# Patient Record
Sex: Female | Born: 1970
Health system: Southern US, Community
[De-identification: ages and names within clinical notes are randomized; demographics above are authoritative.]

## PROBLEM LIST (undated history)

## (undated) DIAGNOSIS — D580 Hereditary spherocytosis: Secondary | ICD-10-CM

## (undated) HISTORY — DX: Hereditary spherocytosis: D58.0

## (undated) HISTORY — PX: FOOT SURGERY: SHX648

---

## 2015-01-08 ENCOUNTER — Ambulatory Visit: Payer: 59

## 2015-01-08 ENCOUNTER — Other Ambulatory Visit (HOSPITAL_BASED_OUTPATIENT_CLINIC_OR_DEPARTMENT_OTHER): Payer: 59 | Admitting: Lab

## 2015-01-08 ENCOUNTER — Encounter: Payer: Self-pay | Admitting: Family

## 2015-01-08 ENCOUNTER — Ambulatory Visit (HOSPITAL_BASED_OUTPATIENT_CLINIC_OR_DEPARTMENT_OTHER): Payer: 59 | Admitting: Family

## 2015-01-08 VITALS — BP 93/53 | HR 68 | Temp 97.8°F | Resp 14 | Ht 65.0 in | Wt 126.0 lb

## 2015-01-08 DIAGNOSIS — D580 Hereditary spherocytosis: Secondary | ICD-10-CM | POA: Diagnosis not present

## 2015-01-08 HISTORY — DX: Hereditary spherocytosis: D58.0

## 2015-01-08 LAB — CBC WITH DIFFERENTIAL (CANCER CENTER ONLY)
BASO#: 0 10*3/uL (ref 0.0–0.2)
BASO%: 0.2 % (ref 0.0–2.0)
EOS%: 2.1 % (ref 0.0–7.0)
Eosinophils Absolute: 0.1 10*3/uL (ref 0.0–0.5)
HCT: 28.9 % — ABNORMAL LOW (ref 34.8–46.6)
HGB: 10.4 g/dL — ABNORMAL LOW (ref 11.6–15.9)
LYMPH#: 1.4 10*3/uL (ref 0.9–3.3)
LYMPH%: 29.4 % (ref 14.0–48.0)
MCH: 33.5 pg (ref 26.0–34.0)
MCHC: 36 g/dL (ref 32.0–36.0)
MCV: 93 fL (ref 81–101)
MONO#: 0.3 10*3/uL (ref 0.1–0.9)
MONO%: 5.7 % (ref 0.0–13.0)
NEUT#: 3.1 10*3/uL (ref 1.5–6.5)
NEUT%: 62.6 % (ref 39.6–80.0)
Platelets: 119 10*3/uL — ABNORMAL LOW (ref 145–400)
RBC: 3.1 10*6/uL — ABNORMAL LOW (ref 3.70–5.32)
RDW: 18.1 % — ABNORMAL HIGH (ref 11.1–15.7)
WBC: 4.9 10*3/uL (ref 3.9–10.0)

## 2015-01-08 LAB — IRON AND TIBC CHCC
%SAT: 31 % (ref 21–57)
Iron: 79 ug/dL (ref 41–142)
TIBC: 253 ug/dL (ref 236–444)
UIBC: 173 ug/dL (ref 120–384)

## 2015-01-08 LAB — TECHNOLOGIST REVIEW CHCC SATELLITE

## 2015-01-08 LAB — CHCC SATELLITE - SMEAR

## 2015-01-08 LAB — FERRITIN CHCC: Ferritin: 76 ng/ml (ref 9–269)

## 2015-01-08 MED ORDER — FOLIC ACID 1 MG PO TABS: 1.0000 mg | ORAL_TABLET | Freq: Every day | ORAL | Status: AC

## 2015-01-08 NOTE — Progress Notes (Signed)
Hematology/Oncology Consultation   Name: Rubylee Zamarripa      MRN: [id number]    Location: Room/bed info not found  Date: 01/08/2015 Time:11:23 AM   REFERRING PHYSICIAN:  Louretta Shorten   REASON FOR CONSULT:  Hereditary spherocytosis   DIAGNOSIS:  Hereditary spherocytosis  HISTORY OF PRESENT ILLNESS: Ms. Sabatino is a very pleasant 44 yo female with hereditary spherocytosis. This was discovered while she was pregnant years ago. Her mother, aunts and cousins also have this and several of them have had their spleens removed. She still has her spleen. She has not had to have transfusions. She takes folic acid, vitamin B complex and a multivitamin daily.  She has 5 healthy children and no miscarriages.  No personal or familial history of bleeding or clotting disorders.  Her mother had uterine cancer and unfortunately passed away from this.  She stays very active running, biking and swimming daily. 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 She does not smoke or drink alcohol. Her cycles are normal and not heavy.  She states that her pap smear and mammogram for this year were both normal.   She denies fever, chills, n/v, cough, rash, headache, dizziness, SOB, chest pain, palpitations, abdominal pain, constipation, diarrhea, blood in urine or stool.  No swelling, tenderness, numbness or tingling in her extremities.  Her appetite is good and she stays hydrated. Her weight is stable at 126 lbs.     ROS: All other 10 point review of systems is negative.   PAST MEDICAL HISTORY:   Past Medical History  Diagnosis Date  . Spherocytosis, hereditary 01/08/2015    ALLERGIES: No Known Allergies    MEDICATIONS:  No current outpatient prescriptions on file prior to visit.   No current facility-administered medications on file prior to visit.     PAST SURGICAL HISTORY No past surgical history on file.  FAMILY HISTORY: No family history on file.  SOCIAL HISTORY:  reports that  she has never smoked. She has never used smokeless tobacco. Her alcohol and drug histories are not on file.  PERFORMANCE STATUS: The patient's performance status is 0 - Asymptomatic  PHYSICAL EXAM: Most Recent Vital Signs: Blood pressure 93/53, pulse 68, temperature 97.8 F (36.6 C), temperature source Oral, resp. rate 14, height 5\' 5"  (1.651 m), weight 126 lb (57.153 kg). BP 93/53 mmHg  Pulse 68  Temp(Src) 97.8 F (36.6 C) (Oral)  Resp 14  Ht 5\' 5"  (1.651 m)  Wt 126 lb (57.153 kg)  BMI 20.97 kg/m2  General Appearance:    Alert, cooperative, no distress, appears stated age  Head:    Normocephalic, without obvious abnormality, atraumatic  Eyes:    PERRL, conjunctiva/corneas clear, EOM's intact, fundi    benign, both eyes        Throat:   Lips, mucosa, and tongue normal; teeth and gums normal  Neck:   Supple, symmetrical, trachea midline, no adenopathy;    thyroid:  no enlargement/tenderness/nodules; no carotid   bruit or JVD  Back:     Symmetric, no curvature, ROM normal, no CVA tenderness  Lungs:     Clear to auscultation bilaterally, respirations unlabored  Chest Wall:    No tenderness or deformity   Heart:    Regular rate and rhythm, S1 and S2 normal, no murmur, rub   or gallop     Abdomen:     Soft, non-tender, bowel sounds active all four quadrants,    no masses, no organomegaly  Extremities:   Extremities normal, atraumatic, no cyanosis or edema  Pulses:   2+ and symmetric all extremities  Skin:   Skin color, texture, turgor normal, no rashes or lesions  Lymph nodes:   Cervical, supraclavicular, and axillary nodes normal  Neurologic:   CNII-XII intact, normal strength, sensation and reflexes    throughout    LABORATORY DATA:  No results found for this or any previous visit (from the past 48 hour(s)).    RADIOGRAPHY: No results found.     PATHOLOGY: None  ASSESSMENT/PLAN: Ms. Jamroz is a very pleasant 44 yo female with hereditary spherocytosis. This was  discovered while she was pregnant years ago. She has had no complications with this and still has her spleen.  She will continue taking her B complex and multivitamin daily.  She will start taking 1 mg of folic acid daily. Her Hgb today is 10.4 MCV 93. We will see what the rest of her labs show.  All questions were answered. She knows to call the clinic with any problems, questions or concerns.  At this time we do not need to make a follow-up appointment with her. However, we are always available for any future hematological need she might have.  The patient was discussed with and also seen by Dr. Marin Olp and he is in agreement with the aforementioned.   [facility]   Addendum:     I saw and examined the patient with Armiyah Capron. We'll get her blood smear. She had some spherocytes. There is no schistocytes. She had no nucleated red blood cells. White blood cells. Normal in morphology and maturation. There were no immature myeloid cells. There were no hypersegmented polys. Platelets appeared adequate in number and size.  It seems as if her spherocytosis is pretty well compensated. She pretty much is asymptomatic with this.  I talked her about the issues with gallstones. There is a higher risk of gallstone disease and cholecystitis with spherocytosis. So far, she has not had any type of gallbladder attacks.  She needs to increase the amount of folic acid that she is taking. She will need to take 1 mg of folic acid daily. We will call the prescription in for her.  I agree with the exam done by Judson Roch.  For now, I just don't think we have to get her back to be seen. She's asymptomatic. She is healthy. If there are any issues, she certainly knows that she can give [place] a call.  We spent about 45 minutes with her today. We answered all of her questions.  Laurey Arrow

## 2015-01-09 LAB — LACTATE DEHYDROGENASE: LDH: 145 U/L (ref 94–250)

## 2015-01-09 LAB — RETICULOCYTES (CHCC)
ABS Retic: 256 10*3/uL — ABNORMAL HIGH (ref 19.0–186.0)
RBC.: 3.2 MIL/uL — ABNORMAL LOW (ref 3.87–5.11)
Retic Ct Pct: 8 % — ABNORMAL HIGH (ref 0.4–2.3)

## 2015-01-09 LAB — HAPTOGLOBIN: Haptoglobin: <15 mg/dL — ABNORMAL LOW (ref 43–212)

## 2015-11-16 LAB — CBC AND DIFFERENTIAL
HCT: 30 % — AB (ref 36–46)
Hemoglobin: 10.7 g/dL — AB (ref 12.0–16.0)
Neutrophils Absolute: 4 /uL
Platelets: 142 10*3/uL — AB (ref 150–399)
WBC: 5.8 10^3/mL

## 2015-11-16 LAB — HEPATIC FUNCTION PANEL
ALT: 14 U/L (ref 7–35)
AST: 18 U/L (ref 13–35)
Alkaline Phosphatase: 44 U/L (ref 25–125)
Bilirubin, Total: 7.1 mg/dL

## 2015-11-16 LAB — BASIC METABOLIC PANEL
BUN: 13 mg/dL (ref 4–21)
Creatinine: 0.6 mg/dL (ref <=1.1)
Potassium: 4.2 mmol/L (ref 3.4–5.3)
Sodium: 141 mmol/L (ref 137–147)

## 2015-11-16 LAB — LIPID PANEL
Cholesterol: 129 mg/dL (ref 0–200)
HDL: 70 mg/dL (ref 35–70)
LDL Cholesterol: 50 mg/dL
Triglycerides: 46 mg/dL (ref 40–160)

## 2015-11-16 LAB — TSH: TSH: 1.35 u[IU]/mL (ref <=5.90)

## 2015-11-26 LAB — HM MAMMOGRAPHY: HM Mammogram: NORMAL (ref 0–4)

## 2015-11-26 LAB — HM PAP SMEAR

## 2015-12-21 DIAGNOSIS — Z6820 Body mass index (BMI) 20.0-20.9, adult: Secondary | ICD-10-CM | POA: Diagnosis not present

## 2015-12-21 DIAGNOSIS — Z1231 Encounter for screening mammogram for malignant neoplasm of breast: Secondary | ICD-10-CM | POA: Diagnosis not present

## 2015-12-21 DIAGNOSIS — Z01419 Encounter for gynecological examination (general) (routine) without abnormal findings: Secondary | ICD-10-CM | POA: Diagnosis not present

## 2015-12-21 LAB — HM PAP SMEAR

## 2015-12-21 LAB — HM MAMMOGRAPHY

## 2016-05-31 DIAGNOSIS — B354 Tinea corporis: Secondary | ICD-10-CM | POA: Diagnosis not present

## 2016-08-05 ENCOUNTER — Encounter: Payer: Self-pay | Admitting: General Practice

## 2016-11-25 ENCOUNTER — Encounter: Payer: Self-pay | Admitting: Family Medicine

## 2016-11-25 ENCOUNTER — Other Ambulatory Visit: Payer: 59

## 2016-11-25 ENCOUNTER — Ambulatory Visit (INDEPENDENT_AMBULATORY_CARE_PROVIDER_SITE_OTHER): Payer: 59 | Admitting: Family Medicine

## 2016-11-25 VITALS — BP 98/62 | HR 52 | Temp 98.1°F | Resp 16 | Ht 65.0 in | Wt 125.2 lb

## 2016-11-25 DIAGNOSIS — Z0000 Encounter for general adult medical examination without abnormal findings: Secondary | ICD-10-CM

## 2016-11-25 DIAGNOSIS — Z1231 Encounter for screening mammogram for malignant neoplasm of breast: Secondary | ICD-10-CM | POA: Diagnosis not present

## 2016-11-25 LAB — LIPID PANEL
Cholesterol: 122 mg/dL (ref 0–200)
HDL: 55.6 mg/dL (ref >39.00)
LDL Cholesterol: 57 mg/dL (ref 0–99)
NonHDL: 66.27
Total CHOL/HDL Ratio: 2
Triglycerides: 47 mg/dL (ref 0.0–149.0)
VLDL: 9.4 mg/dL (ref 0.0–40.0)

## 2016-11-25 LAB — BASIC METABOLIC PANEL
BUN: 16 mg/dL (ref 6–23)
CO2: 27 mEq/L (ref 19–32)
Calcium: 9.3 mg/dL (ref 8.4–10.5)
Chloride: 106 mEq/L (ref 96–112)
Creatinine, Ser: 0.66 mg/dL (ref 0.40–1.20)
GFR: 102.32 mL/min (ref >60.00)
Glucose, Bld: 96 mg/dL (ref 70–99)
Potassium: 4 mEq/L (ref 3.5–5.1)
Sodium: 140 mEq/L (ref 135–145)

## 2016-11-25 LAB — CBC WITH DIFFERENTIAL/PLATELET
Basophils Absolute: 0 cells/uL (ref 0–200)
Basophils Relative: 0 %
Eosinophils Absolute: 88 cells/uL (ref 15–500)
Eosinophils Relative: 2 %
HCT: 31 % — ABNORMAL LOW (ref 35.0–45.0)
Hemoglobin: 10.6 g/dL — ABNORMAL LOW (ref 11.7–15.5)
Lymphocytes Relative: 31 %
Lymphs Abs: 1364 cells/uL (ref 850–3900)
MCH: 32.8 pg (ref 27.0–33.0)
MCHC: 34.2 g/dL (ref 32.0–36.0)
MCV: 96 fL (ref 80.0–100.0)
MPV: 10.8 fL (ref 7.5–12.5)
Monocytes Absolute: 220 cells/uL (ref 200–950)
Monocytes Relative: 5 %
Neutro Abs: 2728 cells/uL (ref 1500–7800)
Neutrophils Relative %: 62 %
Platelets: 137 10*3/uL — ABNORMAL LOW (ref 140–400)
RBC: 3.23 MIL/uL — ABNORMAL LOW (ref 3.80–5.10)
RDW: 16 % — ABNORMAL HIGH (ref 11.0–15.0)
WBC: 4.4 10*3/uL (ref 3.8–10.8)

## 2016-11-25 LAB — HEPATIC FUNCTION PANEL
ALT: 13 U/L (ref 0–35)
AST: 16 U/L (ref 0–37)
Albumin: 4.7 g/dL (ref 3.5–5.2)
Alkaline Phosphatase: 39 U/L (ref 39–117)
Bilirubin, Direct: 0.3 mg/dL (ref 0.0–0.3)
Total Bilirubin: 1.7 mg/dL — ABNORMAL HIGH (ref 0.2–1.2)
Total Protein: 7.1 g/dL (ref 6.0–8.3)

## 2016-11-25 LAB — TSH: TSH: 0.74 u[IU]/mL (ref 0.35–4.50)

## 2016-11-25 LAB — VITAMIN D 25 HYDROXY (VIT D DEFICIENCY, FRACTURES): VITD: 25.79 ng/mL — ABNORMAL LOW (ref 30.00–100.00)

## 2016-11-25 MED ORDER — CLOTRIMAZOLE-BETAMETHASONE 1-0.05 % EX CREA: 1.0000 "application " | TOPICAL_CREAM | Freq: Two times a day (BID) | CUTANEOUS | 1 refills | Status: DC

## 2016-11-25 NOTE — Progress Notes (Signed)
   Subjective:    Patient ID: Leah Willis, female    DOB: 15-Oct-1971, 45 y.o.   MRN: [id number]  HPI New to establish.  Previous MD- [facility]  CPE- UTD on pap and mammo (Dec 2016).  UTD on Tdap.  Declines flu shot.   Review of Systems Patient reports no vision/ hearing changes, adenopathy,fever, weight change,  persistant/recurrent hoarseness , swallowing issues, chest pain, palpitations, edema, persistant/recurrent cough, hemoptysis, dyspnea (rest/exertional/paroxysmal nocturnal), gastrointestinal bleeding (melena, rectal bleeding), abdominal pain, significant heartburn, bowel changes, GU symptoms (dysuria, hematuria, incontinence), Gyn symptoms (abnormal  bleeding, pain),  syncope, focal weakness, memory loss, numbness & tingling, hair/nail changes, abnormal bruising or bleeding, anxiety, or depression.   + skin lesion- L lower leg, has been treated as ring worm w/ some improvement but it has returned    Objective:   Physical Exam General Appearance:    Alert, cooperative, no distress, appears stated age  Head:    Normocephalic, without obvious abnormality, atraumatic  Eyes:    PERRL, conjunctiva/corneas clear, EOM's intact, fundi    benign, both eyes  Ears:    Normal TM's and external ear canals, both ears  Nose:   Nares normal, septum midline, mucosa normal, no drainage    or sinus tenderness  Throat:   Lips, mucosa, and tongue normal; teeth and gums normal  Neck:   Supple, symmetrical, trachea midline, no adenopathy;    Thyroid: no enlargement/tenderness/nodules  Back:     Symmetric, no curvature, ROM normal, no CVA tenderness  Lungs:     Clear to auscultation bilaterally, respirations unlabored  Chest Wall:    No tenderness or deformity   Heart:    Regular rate and rhythm, S1 and S2 normal, no murmur, rub   or gallop  Breast Exam:    Deferred to mammo  Abdomen:     Soft, non-tender, bowel sounds active all four quadrants,    no masses, no  organomegaly  Genitalia:    Deferred to GYN  Rectal:    Extremities:   Extremities normal, atraumatic, no cyanosis or edema  Pulses:   2+ and symmetric all extremities  Skin:   Skin color, texture, turgor normal, no rashes or lesions  Lymph nodes:   Cervical, supraclavicular, and axillary nodes normal  Neurologic:   CNII-XII intact, normal strength, sensation and reflexes    throughout          Assessment & Plan:

## 2016-11-25 NOTE — Progress Notes (Signed)
Pre visit review using our clinic review tool, if applicable. No additional management support is needed unless otherwise documented below in the visit note. 

## 2016-11-25 NOTE — Patient Instructions (Signed)
Follow up in 1 year or as needed We'll notify you of your lab results and make any changes if needed Continue to work on healthy diet and regular exercise- you look great!! We'll call you to schedule your mammogram Apply the Triamcinolone/Steroid combo twice daily.  If no improvement after 2 weeks, please let me know so we can refer to Derm Call with any questions or concerns Happy Holidays! Welcome!  We're glad to have you!!

## 2016-11-25 NOTE — Assessment & Plan Note (Signed)
Pt's PE WNL.  UTD on GYN, Tdap.  Due for mammo- order entered.  Check labs.  Anticipatory guidance provided.

## 2016-11-26 ENCOUNTER — Other Ambulatory Visit: Payer: Self-pay | Admitting: Family Medicine

## 2016-11-26 ENCOUNTER — Other Ambulatory Visit: Payer: Self-pay | Admitting: General Practice

## 2016-11-26 DIAGNOSIS — Z1231 Encounter for screening mammogram for malignant neoplasm of breast: Secondary | ICD-10-CM

## 2016-11-26 MED ORDER — VITAMIN D (ERGOCALCIFEROL) 1.25 MG (50000 UNIT) PO CAPS: 50000.0000 [IU] | ORAL_CAPSULE | ORAL | 0 refills | Status: DC

## 2016-12-02 ENCOUNTER — Encounter: Payer: Self-pay | Admitting: General Practice

## 2016-12-23 ENCOUNTER — Ambulatory Visit: Payer: 59 | Admitting: Family Medicine

## 2017-01-01 ENCOUNTER — Ambulatory Visit: Payer: 59

## 2017-06-09 DIAGNOSIS — H5213 Myopia, bilateral: Secondary | ICD-10-CM | POA: Diagnosis not present

## 2017-11-27 ENCOUNTER — Ambulatory Visit (INDEPENDENT_AMBULATORY_CARE_PROVIDER_SITE_OTHER): Payer: 59 | Admitting: Family Medicine

## 2017-11-27 ENCOUNTER — Other Ambulatory Visit: Payer: Self-pay

## 2017-11-27 ENCOUNTER — Other Ambulatory Visit: Payer: 59

## 2017-11-27 ENCOUNTER — Encounter: Payer: Self-pay | Admitting: Family Medicine

## 2017-11-27 VITALS — BP 118/78 | HR 68 | Temp 98.2°F | Resp 16 | Ht 65.0 in | Wt 129.5 lb

## 2017-11-27 DIAGNOSIS — R7989 Other specified abnormal findings of blood chemistry: Secondary | ICD-10-CM | POA: Diagnosis not present

## 2017-11-27 DIAGNOSIS — Z0000 Encounter for general adult medical examination without abnormal findings: Secondary | ICD-10-CM | POA: Diagnosis not present

## 2017-11-27 DIAGNOSIS — E559 Vitamin D deficiency, unspecified: Secondary | ICD-10-CM

## 2017-11-27 LAB — CBC WITH DIFFERENTIAL/PLATELET
Basophils Absolute: 31 cells/uL (ref 0–200)
Basophils Relative: 0.5 %
Eosinophils Absolute: 92 cells/uL (ref 15–500)
Eosinophils Relative: 1.5 %
HCT: 29.4 % — ABNORMAL LOW (ref 35.0–45.0)
Hemoglobin: 10.3 g/dL — ABNORMAL LOW (ref 11.7–15.5)
Lymphs Abs: 1549 cells/uL (ref 850–3900)
MCH: 33 pg (ref 27.0–33.0)
MCHC: 35 g/dL (ref 32.0–36.0)
MCV: 94.2 fL (ref 80.0–100.0)
MPV: 12.2 fL (ref 7.5–12.5)
Monocytes Relative: 4.9 %
Neutro Abs: 4130 cells/uL (ref 1500–7800)
Neutrophils Relative %: 67.7 %
Platelets: 109 10*3/uL — ABNORMAL LOW (ref 140–400)
RBC: 3.12 10*6/uL — ABNORMAL LOW (ref 3.80–5.10)
RDW: 15.9 % — ABNORMAL HIGH (ref 11.0–15.0)
Total Lymphocyte: 25.4 %
WBC mixed population: 299 cells/uL (ref 200–950)
WBC: 6.1 10*3/uL (ref 3.8–10.8)

## 2017-11-27 LAB — LIPID PANEL
Cholesterol: 115 mg/dL (ref 0–200)
HDL: 56.1 mg/dL (ref >39.00)
LDL Cholesterol: 49 mg/dL (ref 0–99)
NonHDL: 59.12
Total CHOL/HDL Ratio: 2
Triglycerides: 52 mg/dL (ref 0.0–149.0)
VLDL: 10.4 mg/dL (ref 0.0–40.0)

## 2017-11-27 LAB — BASIC METABOLIC PANEL
BUN: 12 mg/dL (ref 6–23)
CO2: 28 mEq/L (ref 19–32)
Calcium: 9.4 mg/dL (ref 8.4–10.5)
Chloride: 106 mEq/L (ref 96–112)
Creatinine, Ser: 0.73 mg/dL (ref 0.40–1.20)
GFR: 90.69 mL/min (ref >60.00)
Glucose, Bld: 79 mg/dL (ref 70–99)
Potassium: 4.3 mEq/L (ref 3.5–5.1)
Sodium: 139 mEq/L (ref 135–145)

## 2017-11-27 LAB — HEPATIC FUNCTION PANEL
ALT: 13 U/L (ref 0–35)
AST: 16 U/L (ref 0–37)
Albumin: 4.5 g/dL (ref 3.5–5.2)
Alkaline Phosphatase: 35 U/L — ABNORMAL LOW (ref 39–117)
Bilirubin, Direct: 0.3 mg/dL (ref 0.0–0.3)
Total Bilirubin: 1.6 mg/dL — ABNORMAL HIGH (ref 0.2–1.2)
Total Protein: 6.9 g/dL (ref 6.0–8.3)

## 2017-11-27 LAB — VITAMIN D 25 HYDROXY (VIT D DEFICIENCY, FRACTURES): VITD: 46.9 ng/mL (ref 30.00–100.00)

## 2017-11-27 LAB — TSH: TSH: 0.72 u[IU]/mL (ref 0.35–4.50)

## 2017-11-27 NOTE — Progress Notes (Signed)
   Subjective:    Patient ID: Leah Willis, female    DOB: 07/31/1971, 46 y.o.   MRN: [id number]  HPI CPE- UTD on GYN w/ Dr Corinna Capra.  UTD on Tdap.  Declines flu shot.  No concerns today.   Review of Systems Patient reports no vision/ hearing changes, adenopathy,fever, weight change,  persistant/recurrent hoarseness , swallowing issues, chest pain, palpitations, edema, persistant/recurrent cough, hemoptysis, dyspnea (rest/exertional/paroxysmal nocturnal), gastrointestinal bleeding (melena, rectal bleeding), abdominal pain, significant heartburn, bowel changes, GU symptoms (dysuria, hematuria, incontinence), Gyn symptoms (abnormal  bleeding, pain),  syncope, focal weakness, memory loss, numbness & tingling, skin/hair/nail changes, abnormal bruising or bleeding, anxiety, or depression.     Objective:   Physical Exam General Appearance:    Alert, cooperative, no distress, appears stated age  Head:    Normocephalic, without obvious abnormality, atraumatic  Eyes:    PERRL, conjunctiva/corneas clear, EOM's intact, fundi    benign, both eyes  Ears:    Normal TM's and external ear canals, both ears  Nose:   Nares normal, septum midline, mucosa normal, no drainage    or sinus tenderness  Throat:   Lips, mucosa, and tongue normal; teeth and gums normal  Neck:   Supple, symmetrical, trachea midline, no adenopathy;    Thyroid: no enlargement/tenderness/nodules  Back:     Symmetric, no curvature, ROM normal, no CVA tenderness  Lungs:     Clear to auscultation bilaterally, respirations unlabored  Chest Wall:    No tenderness or deformity   Heart:    Regular rate and rhythm, S1 and S2 normal, no murmur, rub   or gallop  Breast Exam:    Deferred to GYN  Abdomen:     Soft, non-tender, bowel sounds active all four quadrants,    no masses, no organomegaly  Genitalia:    Deferred to GYN  Rectal:    Extremities:   Extremities normal, atraumatic, no cyanosis or edema  Pulses:   2+ and symmetric all  extremities  Skin:   Skin color, texture, turgor normal, no rashes or lesions  Lymph nodes:   Cervical, supraclavicular, and axillary nodes normal  Neurologic:   CNII-XII intact, normal strength, sensation and reflexes    throughout          Assessment & Plan:

## 2017-11-27 NOTE — Assessment & Plan Note (Signed)
Pt has hx of low Vit D.  Check labs and replete prn. 

## 2017-11-27 NOTE — Assessment & Plan Note (Signed)
Pt's PE WNL.  UTD on GYN.  Check labs.  Anticipatory guidance provided.  

## 2017-11-27 NOTE — Patient Instructions (Signed)
Follow up in 1 year or as needed We'll notify you of your lab results and make any changes if needed Call insurance and verify that you can have both a GYN exam and a medical physical Keep up the good work on healthy diet and regular exercise- you look great! Call with any questions or concerns Happy Holidays!!!

## 2017-11-30 ENCOUNTER — Other Ambulatory Visit: Payer: Self-pay | Admitting: Family Medicine

## 2017-11-30 DIAGNOSIS — D691 Qualitative platelet defects: Secondary | ICD-10-CM

## 2018-01-05 ENCOUNTER — Other Ambulatory Visit (INDEPENDENT_AMBULATORY_CARE_PROVIDER_SITE_OTHER): Payer: 59

## 2018-01-05 DIAGNOSIS — D691 Qualitative platelet defects: Secondary | ICD-10-CM

## 2018-01-05 LAB — CBC WITH DIFFERENTIAL/PLATELET
Basophils Absolute: 0.1 10*3/uL (ref 0.0–0.1)
Basophils Relative: 2.5 % (ref 0.0–3.0)
Eosinophils Absolute: 0.3 10*3/uL (ref 0.0–0.7)
Eosinophils Relative: 5.7 % — ABNORMAL HIGH (ref 0.0–5.0)
HCT: 30.4 % — ABNORMAL LOW (ref 36.0–46.0)
Hemoglobin: 10.9 g/dL — ABNORMAL LOW (ref 12.0–15.0)
Lymphocytes Relative: 27.9 % (ref 12.0–46.0)
Lymphs Abs: 1.3 10*3/uL (ref 0.7–4.0)
MCHC: 35.9 g/dL (ref 30.0–36.0)
MCV: 94 fl (ref 78.0–100.0)
Monocytes Absolute: 0.2 10*3/uL (ref 0.1–1.0)
Monocytes Relative: 4.9 % (ref 3.0–12.0)
Neutro Abs: 2.8 10*3/uL (ref 1.4–7.7)
Neutrophils Relative %: 59 % (ref 43.0–77.0)
Platelets: 123 10*3/uL — ABNORMAL LOW (ref 150.0–400.0)
RBC: 3.24 Mil/uL — ABNORMAL LOW (ref 3.87–5.11)
RDW: 17.9 % — ABNORMAL HIGH (ref 11.5–15.5)
WBC: 4.8 10*3/uL (ref 4.0–10.5)

## 2018-01-06 ENCOUNTER — Encounter: Payer: Self-pay | Admitting: General Practice

## 2018-11-09 DIAGNOSIS — H52203 Unspecified astigmatism, bilateral: Secondary | ICD-10-CM | POA: Diagnosis not present

## 2018-11-09 DIAGNOSIS — H5213 Myopia, bilateral: Secondary | ICD-10-CM | POA: Diagnosis not present

## 2018-11-09 DIAGNOSIS — H524 Presbyopia: Secondary | ICD-10-CM | POA: Diagnosis not present

## 2018-11-30 ENCOUNTER — Other Ambulatory Visit: Payer: 59

## 2018-11-30 ENCOUNTER — Encounter: Payer: Self-pay | Admitting: Family Medicine

## 2018-11-30 ENCOUNTER — Other Ambulatory Visit: Payer: Self-pay

## 2018-11-30 ENCOUNTER — Ambulatory Visit (INDEPENDENT_AMBULATORY_CARE_PROVIDER_SITE_OTHER): Payer: 59 | Admitting: Family Medicine

## 2018-11-30 VITALS — BP 114/68 | HR 68 | Temp 98.1°F | Resp 16 | Ht 65.5 in | Wt 128.5 lb

## 2018-11-30 DIAGNOSIS — Z0000 Encounter for general adult medical examination without abnormal findings: Secondary | ICD-10-CM

## 2018-11-30 DIAGNOSIS — R5383 Other fatigue: Secondary | ICD-10-CM | POA: Diagnosis not present

## 2018-11-30 DIAGNOSIS — E559 Vitamin D deficiency, unspecified: Secondary | ICD-10-CM | POA: Diagnosis not present

## 2018-11-30 LAB — CBC WITH DIFFERENTIAL/PLATELET
Absolute Monocytes: 250 cells/uL (ref 200–950)
Basophils Absolute: 21 cells/uL (ref 0–200)
Basophils Relative: 0.4 %
Eosinophils Absolute: 88 cells/uL (ref 15–500)
Eosinophils Relative: 1.7 %
HCT: 31.3 % — ABNORMAL LOW (ref 35.0–45.0)
Hemoglobin: 10.6 g/dL — ABNORMAL LOW (ref 11.7–15.5)
Lymphs Abs: 1368 cells/uL (ref 850–3900)
MCH: 32.5 pg (ref 27.0–33.0)
MCHC: 33.9 g/dL (ref 32.0–36.0)
MCV: 96 fL (ref 80.0–100.0)
MPV: 11.7 fL (ref 7.5–12.5)
Monocytes Relative: 4.8 %
Neutro Abs: 3474 cells/uL (ref 1500–7800)
Neutrophils Relative %: 66.8 %
Platelets: 151 10*3/uL (ref 140–400)
RBC: 3.26 10*6/uL — ABNORMAL LOW (ref 3.80–5.10)
RDW: 15.9 % — ABNORMAL HIGH (ref 11.0–15.0)
Total Lymphocyte: 26.3 %
WBC: 5.2 10*3/uL (ref 3.8–10.8)

## 2018-11-30 LAB — HEPATIC FUNCTION PANEL
ALT: 12 U/L (ref 0–35)
AST: 16 U/L (ref 0–37)
Albumin: 4.5 g/dL (ref 3.5–5.2)
Alkaline Phosphatase: 38 U/L — ABNORMAL LOW (ref 39–117)
Bilirubin, Direct: 0.3 mg/dL (ref 0.0–0.3)
Total Bilirubin: 1.7 mg/dL — ABNORMAL HIGH (ref 0.2–1.2)
Total Protein: 6.9 g/dL (ref 6.0–8.3)

## 2018-11-30 LAB — BASIC METABOLIC PANEL
BUN: 14 mg/dL (ref 6–23)
CO2: 26 mEq/L (ref 19–32)
Calcium: 9.4 mg/dL (ref 8.4–10.5)
Chloride: 106 mEq/L (ref 96–112)
Creatinine, Ser: 0.7 mg/dL (ref 0.40–1.20)
GFR: 94.78 mL/min (ref >60.00)
Glucose, Bld: 98 mg/dL (ref 70–99)
Potassium: 4 mEq/L (ref 3.5–5.1)
Sodium: 138 mEq/L (ref 135–145)

## 2018-11-30 LAB — VITAMIN D 25 HYDROXY (VIT D DEFICIENCY, FRACTURES): VITD: 44.24 ng/mL (ref 30.00–100.00)

## 2018-11-30 LAB — LIPID PANEL
Cholesterol: 126 mg/dL (ref 0–200)
HDL: 52.2 mg/dL (ref >39.00)
LDL Cholesterol: 63 mg/dL (ref 0–99)
NonHDL: 74.18
Total CHOL/HDL Ratio: 2
Triglycerides: 58 mg/dL (ref 0.0–149.0)
VLDL: 11.6 mg/dL (ref 0.0–40.0)

## 2018-11-30 LAB — TSH: TSH: 1.07 u[IU]/mL (ref 0.35–4.50)

## 2018-11-30 NOTE — Progress Notes (Signed)
   Subjective:    Patient ID: Leah Willis, female    DOB: September 21, 1971, 47 y.o.   MRN: [id number]  HPI CPE- UTD on pap, due for mammo.  Declines flu.  UTD on Tdap.  No concerns today.   Review of Systems Patient reports no vision/ hearing changes, adenopathy,fever, weight change,  persistant/recurrent hoarseness , swallowing issues, chest pain, palpitations, edema, persistant/recurrent cough, hemoptysis, dyspnea (rest/exertional/paroxysmal nocturnal), gastrointestinal bleeding (melena, rectal bleeding), abdominal pain, significant heartburn, bowel changes, GU symptoms (dysuria, hematuria, incontinence), Gyn symptoms (abnormal  bleeding, pain),  syncope, focal weakness, memory loss, numbness & tingling, skin/hair/nail changes, abnormal bruising or bleeding, anxiety, or depression.   + fatigue    Objective:   Physical Exam General Appearance:    Alert, cooperative, no distress, appears stated age  Head:    Normocephalic, without obvious abnormality, atraumatic  Eyes:    PERRL, conjunctiva/corneas clear, EOM's intact, fundi    benign, both eyes  Ears:    Normal TM's and external ear canals, both ears  Nose:   Nares normal, septum midline, mucosa normal, no drainage    or sinus tenderness  Throat:   Lips, mucosa, and tongue normal; teeth and gums normal  Neck:   Supple, symmetrical, trachea midline, no adenopathy;    Thyroid: no enlargement/tenderness/nodules  Back:     Symmetric, no curvature, ROM normal, no CVA tenderness  Lungs:     Clear to auscultation bilaterally, respirations unlabored  Chest Wall:    No tenderness or deformity   Heart:    Regular rate and rhythm, S1 and S2 normal, no murmur, rub   or gallop  Breast Exam:    Deferred to GYN  Abdomen:     Soft, non-tender, bowel sounds active all four quadrants,    no masses, no organomegaly  Genitalia:    Deferred to GYN  Rectal:    Extremities:   Extremities normal, atraumatic, no cyanosis or edema  Pulses:   2+ and symmetric  all extremities  Skin:   Skin color, texture, turgor normal, no rashes or lesions  Lymph nodes:   Cervical, supraclavicular, and axillary nodes normal  Neurologic:   CNII-XII intact, normal strength, sensation and reflexes    throughout          Assessment & Plan:  Fatigue- 'i'm more tired than I should be'.  Check labs to determine if there's a metabolic cause.

## 2018-11-30 NOTE — Assessment & Plan Note (Signed)
Pt has hx of this.  Check labs and replete prn. 

## 2018-11-30 NOTE — Patient Instructions (Addendum)
Follow up in 1 year or as needed We'll notify you of your lab results and make any changes if needed Keep up the good work!  You look great!!! Try and keep a log of your chest heaviness and see if there are particular triggers or pattern and if this continues- let me know! SCHEDULE YOUR MAMMOGRAM and have them send me the results Call with any questions or concerns Happy Holidays!!!

## 2018-11-30 NOTE — Assessment & Plan Note (Signed)
Pt's PE WNL.  UTD on pap, due for mammo.  Declines flu.  Check labs.  Anticipatory guidance provided.

## 2019-02-16 DIAGNOSIS — Z6826 Body mass index (BMI) 26.0-26.9, adult: Secondary | ICD-10-CM | POA: Diagnosis not present

## 2019-02-16 DIAGNOSIS — Z01419 Encounter for gynecological examination (general) (routine) without abnormal findings: Secondary | ICD-10-CM | POA: Diagnosis not present

## 2019-02-16 DIAGNOSIS — Z1231 Encounter for screening mammogram for malignant neoplasm of breast: Secondary | ICD-10-CM | POA: Diagnosis not present

## 2019-02-16 LAB — HM MAMMOGRAPHY

## 2019-03-07 DIAGNOSIS — Z01419 Encounter for gynecological examination (general) (routine) without abnormal findings: Secondary | ICD-10-CM | POA: Diagnosis not present

## 2019-03-07 DIAGNOSIS — R87615 Unsatisfactory cytologic smear of cervix: Secondary | ICD-10-CM | POA: Diagnosis not present

## 2019-03-09 LAB — HM PAP SMEAR

## 2019-04-27 ENCOUNTER — Encounter: Payer: Self-pay | Admitting: General Practice

## 2019-12-06 ENCOUNTER — Encounter: Payer: Self-pay | Admitting: Family Medicine

## 2019-12-06 ENCOUNTER — Ambulatory Visit (INDEPENDENT_AMBULATORY_CARE_PROVIDER_SITE_OTHER): Payer: 59 | Admitting: Family Medicine

## 2019-12-06 ENCOUNTER — Other Ambulatory Visit: Payer: Self-pay

## 2019-12-06 VITALS — BP 112/70 | HR 77 | Temp 97.9°F | Resp 16 | Ht 66.0 in | Wt 129.5 lb

## 2019-12-06 DIAGNOSIS — Z0000 Encounter for general adult medical examination without abnormal findings: Secondary | ICD-10-CM

## 2019-12-06 DIAGNOSIS — E559 Vitamin D deficiency, unspecified: Secondary | ICD-10-CM | POA: Diagnosis not present

## 2019-12-06 LAB — HEPATIC FUNCTION PANEL
ALT: 11 U/L (ref 0–35)
AST: 14 U/L (ref 0–37)
Albumin: 4.5 g/dL (ref 3.5–5.2)
Alkaline Phosphatase: 37 U/L — ABNORMAL LOW (ref 39–117)
Bilirubin, Direct: 0.3 mg/dL (ref 0.0–0.3)
Total Bilirubin: 1.6 mg/dL — ABNORMAL HIGH (ref 0.2–1.2)
Total Protein: 6.5 g/dL (ref 6.0–8.3)

## 2019-12-06 LAB — BASIC METABOLIC PANEL
BUN: 13 mg/dL (ref 6–23)
CO2: 24 mEq/L (ref 19–32)
Calcium: 9 mg/dL (ref 8.4–10.5)
Chloride: 108 mEq/L (ref 96–112)
Creatinine, Ser: 0.68 mg/dL (ref 0.40–1.20)
GFR: 91.83 mL/min (ref >60.00)
Glucose, Bld: 90 mg/dL (ref 70–99)
Potassium: 3.8 mEq/L (ref 3.5–5.1)
Sodium: 139 mEq/L (ref 135–145)

## 2019-12-06 LAB — CBC WITH DIFFERENTIAL/PLATELET
Basophils Absolute: 0.2 10*3/uL — ABNORMAL HIGH (ref 0.0–0.1)
Basophils Relative: 4 % — ABNORMAL HIGH (ref 0.0–3.0)
Eosinophils Absolute: 0 10*3/uL (ref 0.0–0.7)
Eosinophils Relative: 0.5 % (ref 0.0–5.0)
HCT: 28.6 % — ABNORMAL LOW (ref 36.0–46.0)
Hemoglobin: 10.3 g/dL — ABNORMAL LOW (ref 12.0–15.0)
Lymphocytes Relative: 18.8 % (ref 12.0–46.0)
Lymphs Abs: 1 10*3/uL (ref 0.7–4.0)
MCHC: 36.1 g/dL — ABNORMAL HIGH (ref 30.0–36.0)
MCV: 92.5 fl (ref 78.0–100.0)
Monocytes Absolute: 0.3 10*3/uL (ref 0.1–1.0)
Monocytes Relative: 5.4 % (ref 3.0–12.0)
Neutro Abs: 3.8 10*3/uL (ref 1.4–7.7)
Neutrophils Relative %: 71.3 % (ref 43.0–77.0)
Platelets: 125 10*3/uL — ABNORMAL LOW (ref 150.0–400.0)
RBC: 3.09 Mil/uL — ABNORMAL LOW (ref 3.87–5.11)
RDW: 18 % — ABNORMAL HIGH (ref 11.5–15.5)
WBC: 5.3 10*3/uL (ref 4.0–10.5)

## 2019-12-06 LAB — VITAMIN D 25 HYDROXY (VIT D DEFICIENCY, FRACTURES): VITD: 39.5 ng/mL (ref 30.00–100.00)

## 2019-12-06 LAB — LIPID PANEL
Cholesterol: 125 mg/dL (ref 0–200)
HDL: 49.7 mg/dL (ref >39.00)
LDL Cholesterol: 65 mg/dL (ref 0–99)
NonHDL: 75.71
Total CHOL/HDL Ratio: 3
Triglycerides: 54 mg/dL (ref 0.0–149.0)
VLDL: 10.8 mg/dL (ref 0.0–40.0)

## 2019-12-06 LAB — TSH: TSH: 0.91 u[IU]/mL (ref 0.35–4.50)

## 2019-12-06 NOTE — Assessment & Plan Note (Signed)
Check labs and replete prn. 

## 2019-12-06 NOTE — Assessment & Plan Note (Signed)
Pt's PE WNL.  UTD on pap and mammo.  Declines flu.  UTD on Tdap.  Check labs.  Anticipatory guidance provided.

## 2019-12-06 NOTE — Patient Instructions (Signed)
Follow up in 1 year or as needed We'll notify you of your lab results and make any changes if needed Keep up the good work!  You look great! Call with any questions or concerns Stay Safe!  Stay Healthy! Happy Holidays!!! 

## 2019-12-06 NOTE — Progress Notes (Signed)
   Subjective:    Patient ID: Leah Willis, female    DOB: [birth year], 48 y.o.   MRN: [id number]  HPI CPE- UTD on pap, mammo, Tdap.  Declines flu.  No concerns today.   Review of Systems Patient reports no vision/ hearing changes, adenopathy,fever, weight change,  persistant/recurrent hoarseness , swallowing issues, chest pain, palpitations, edema, persistant/recurrent cough, hemoptysis, dyspnea (rest/exertional/paroxysmal nocturnal), gastrointestinal bleeding (melena, rectal bleeding), abdominal pain, significant heartburn, bowel changes, GU symptoms (dysuria, hematuria, incontinence), Gyn symptoms (abnormal  bleeding, pain),  syncope, focal weakness, memory loss, numbness & tingling, skin/hair/nail changes, abnormal bruising or bleeding, anxiety, or depression.   This visit occurred during the SARS-CoV-2 public health emergency.  Safety protocols were in place, including screening questions prior to the visit, additional usage of staff PPE, and extensive cleaning of exam room while observing appropriate contact time as indicated for disinfecting solutions.       Objective:   Physical Exam General Appearance:    Alert, cooperative, no distress, appears stated age  Head:    Normocephalic, without obvious abnormality, atraumatic  Eyes:    PERRL, conjunctiva/corneas clear, EOM's intact, fundi    benign, both eyes  Ears:    Normal TM's and external ear canals, both ears  Nose:   Deferred due to COVID  Throat:   Neck:   Supple, symmetrical, trachea midline, no adenopathy;    Thyroid: no enlargement/tenderness/nodules  Back:     Symmetric, no curvature, ROM normal, no CVA tenderness  Lungs:     Clear to auscultation bilaterally, respirations unlabored  Chest Wall:    No tenderness or deformity   Heart:    Regular rate and rhythm, S1 and S2 normal, no murmur, rub   or gallop  Breast Exam:    Deferred to GYN  Abdomen:     Soft, non-tender, bowel sounds active all four quadrants,    no  masses, no organomegaly  Genitalia:    Deferred to GYN  Rectal:    Extremities:   Extremities normal, atraumatic, no cyanosis or edema  Pulses:   2+ and symmetric all extremities  Skin:   Skin color, texture, turgor normal, no rashes or lesions  Lymph nodes:   Cervical, supraclavicular, and axillary nodes normal  Neurologic:   CNII-XII intact, normal strength, sensation and reflexes    throughout          Assessment & Plan:

## 2020-01-01 ENCOUNTER — Ambulatory Visit: Payer: 59 | Attending: Internal Medicine

## 2020-01-01 DIAGNOSIS — Z23 Encounter for immunization: Secondary | ICD-10-CM | POA: Diagnosis not present

## 2020-01-01 NOTE — Progress Notes (Signed)
   Covid-19 Vaccination Clinic  Name:  Leah Willis    MRN: [id number] DOB: [birth year]  01/01/2020  Leah Willis was observed post Covid-19 immunization for 15 minutes without incidence. She was provided with Vaccine Information Sheet and instruction to access the V-Safe system.   Leah Willis was instructed to call 911 with any severe reactions post vaccine: Marland Kitchen Difficulty breathing  . Swelling of your face and throat  . A fast heartbeat  . A bad rash all over your body  . Dizziness and weakness

## 2020-01-19 ENCOUNTER — Ambulatory Visit: Payer: 59 | Attending: Internal Medicine

## 2020-01-19 DIAGNOSIS — Z23 Encounter for immunization: Secondary | ICD-10-CM | POA: Insufficient documentation

## 2020-01-19 NOTE — Progress Notes (Signed)
   Covid-19 Vaccination Clinic  Name:  Leah Willis    MRN: [id number] DOB: Jan 05, 1971  01/19/2020  [name] was observed post Covid-19 immunization for 15 minutes without incidence. She was provided with Vaccine Information Sheet and instruction to access the V-Safe system.   [name] was instructed to call 911 with any severe reactions post vaccine: Marland Kitchen Difficulty breathing  . Swelling of your face and throat  . A fast heartbeat  . A bad rash all over your body  . Dizziness and weakness    Immunizations Administered    Name Date Dose VIS Date Route   Pfizer COVID-19 Vaccine 01/19/2020  2:40 PM 0.3 mL 11/25/2019 Intramuscular   Manufacturer: [place]   Lot: [id number]   [place]: [id number]

## 2020-08-01 DIAGNOSIS — G43909 Migraine, unspecified, not intractable, without status migrainosus: Secondary | ICD-10-CM | POA: Insufficient documentation

## 2020-08-07 DIAGNOSIS — D649 Anemia, unspecified: Secondary | ICD-10-CM | POA: Insufficient documentation

## 2020-08-07 DIAGNOSIS — Z01419 Encounter for gynecological examination (general) (routine) without abnormal findings: Secondary | ICD-10-CM | POA: Diagnosis not present

## 2020-08-07 DIAGNOSIS — Z1231 Encounter for screening mammogram for malignant neoplasm of breast: Secondary | ICD-10-CM | POA: Diagnosis not present

## 2020-08-07 DIAGNOSIS — Z6821 Body mass index (BMI) 21.0-21.9, adult: Secondary | ICD-10-CM | POA: Diagnosis not present

## 2020-08-10 ENCOUNTER — Encounter: Payer: Self-pay | Admitting: Gastroenterology

## 2020-09-12 DIAGNOSIS — H5213 Myopia, bilateral: Secondary | ICD-10-CM | POA: Diagnosis not present

## 2020-09-14 ENCOUNTER — Encounter: Payer: Self-pay | Admitting: Gastroenterology

## 2020-09-14 ENCOUNTER — Other Ambulatory Visit: Payer: Self-pay | Admitting: Gastroenterology

## 2020-09-14 ENCOUNTER — Ambulatory Visit (AMBULATORY_SURGERY_CENTER): Payer: Self-pay | Admitting: *Deleted

## 2020-09-14 ENCOUNTER — Other Ambulatory Visit: Payer: Self-pay

## 2020-09-14 VITALS — Ht 66.0 in | Wt 135.2 lb

## 2020-09-14 DIAGNOSIS — Z1211 Encounter for screening for malignant neoplasm of colon: Secondary | ICD-10-CM

## 2020-09-14 MED ORDER — SUPREP BOWEL PREP KIT 17.5-3.13-1.6 GM/177ML PO SOLN: ORAL | 0 refills | Status: DC

## 2020-09-14 MED FILL — SUPREP BOWEL PREP KIT: 17.5-3.13-1 | 1 days supply | Qty: 354 | Fill #0

## 2020-09-14 NOTE — Progress Notes (Signed)
Completed covid vaccines 01-19-20  Pt is aware that care partner will wait in the car during procedure; if they feel like they will be too hot or cold to wait in the car; they may wait in the 4 th floor lobby. Patient is aware to bring only one care partner. We want them to wear a mask (we do not have any that we can provide them), practice social distancing, and we will check their temperatures when they get here.  I did remind the patient that their care partner needs to stay in the parking lot the entire time and have a cell phone available, we will call them when the pt is ready for discharge. Patient will wear mask into building.   No trouble with anesthesia, difficulty with intubation or hx/fam hx of malignant hyperthermia per pt   No egg or soy allergy  No home oxygen use   No medications for weight loss taken  emmi information given  Pt denies constipation issues

## 2020-10-11 ENCOUNTER — Other Ambulatory Visit: Payer: Self-pay

## 2020-10-11 ENCOUNTER — Ambulatory Visit (AMBULATORY_SURGERY_CENTER): Payer: 59 | Admitting: Gastroenterology

## 2020-10-11 ENCOUNTER — Encounter: Payer: Self-pay | Admitting: Gastroenterology

## 2020-10-11 VITALS — BP 92/70 | HR 60 | Temp 97.3°F | Resp 15 | Ht 66.0 in | Wt 135.2 lb

## 2020-10-11 DIAGNOSIS — Z1211 Encounter for screening for malignant neoplasm of colon: Secondary | ICD-10-CM

## 2020-10-11 MED ORDER — SODIUM CHLORIDE 0.9 % IV SOLN: 500.0000 mL | Freq: Once | INTRAVENOUS | Status: DC

## 2020-10-11 NOTE — Op Note (Signed)
[facility] Patient Name: Leah Willis Procedure Date: 10/11/2020 9:01 AM MRN: [id number] Endoscopist: Mauri Pole , MD Age: 49 Referring MD:  Date of Birth: 03-08-1971 Gender: Female Account #: [id number] Procedure:                Colonoscopy Indications:              Screening for colorectal malignant neoplasm Medicines:                Monitored Anesthesia Care Procedure:                Pre-Anesthesia Assessment:                           - Prior to the procedure, a History and Physical                            was performed, and patient medications and                            allergies were reviewed. The patient's tolerance of                            previous anesthesia was also reviewed. The risks                            and benefits of the procedure and the sedation                            options and risks were discussed with the patient.                            All questions were answered, and informed consent                            was obtained. Prior Anticoagulants: The patient has                            taken no previous anticoagulant or antiplatelet                            agents. ASA Grade Assessment: I - A normal, healthy                            patient. After reviewing the risks and benefits,                            the patient was deemed in satisfactory condition to                            undergo the procedure.                           After obtaining informed consent, the colonoscope  was passed under direct vision. Throughout the                            procedure, the patient's blood pressure, pulse, and                            oxygen saturations were monitored continuously. The                            Colonoscope was introduced through the anus and                            advanced to the the cecum, identified by                            appendiceal orifice and ileocecal  valve. The                            colonoscopy was performed without difficulty. The                            patient tolerated the procedure well. The quality                            of the bowel preparation was adequate. The                            ileocecal valve, appendiceal orifice, and rectum                            were photographed. Scope In: 9:12:12 AM Scope Out: 9:31:16 AM Scope Withdrawal Time: 0 hours 8 minutes 23 seconds  Total Procedure Duration: 0 hours 19 minutes 4 seconds  Findings:                 The perianal and digital rectal examinations were                            normal.                           Non-bleeding internal hemorrhoids were found during                            retroflexion. The hemorrhoids were large.                           The exam was otherwise without abnormality. Complications:            No immediate complications. Estimated Blood Loss:     Estimated blood loss: none. Impression:               - Non-bleeding internal hemorrhoids.                           - The examination was otherwise normal.                           -  No specimens collected. Recommendation:           - Patient has a contact number available for                            emergencies. The signs and symptoms of potential                            delayed complications were discussed with the                            patient. Return to normal activities tomorrow.                            Written discharge instructions were provided to the                            patient.                           - Resume previous diet.                           - Continue present medications.                           - Repeat colonoscopy in 10 years for screening                            purposes. Mauri Pole, MD 10/11/2020 9:35:24 AM This report has been signed electronically.

## 2020-10-11 NOTE — Progress Notes (Signed)
Report to PACU, RN, vss, BBS= Clear.  

## 2020-10-11 NOTE — Patient Instructions (Signed)
Handout on hemorrhoids given to you today   YOU HAD AN ENDOSCOPIC PROCEDURE TODAY AT [facility]:   Refer to the procedure report that was given to you for any specific questions about what was found during the examination.  If the procedure report does not answer your questions, please call your gastroenterologist to clarify.  If you requested that your care partner not be given the details of your procedure findings, then the procedure report has been included in a sealed envelope for you to review at your convenience later.  YOU SHOULD EXPECT: Some feelings of bloating in the abdomen. Passage of more gas than usual.  Walking can help get rid of the air that was put into your GI tract during the procedure and reduce the bloating. If you had a lower endoscopy (such as a colonoscopy or flexible sigmoidoscopy) you may notice spotting of blood in your stool or on the toilet paper. If you underwent a bowel prep for your procedure, you may not have a normal bowel movement for a few days.  Please Note:  You might notice some irritation and congestion in your nose or some drainage.  This is from the oxygen used during your procedure.  There is no need for concern and it should clear up in a day or so.  SYMPTOMS TO REPORT IMMEDIATELY:   Following lower endoscopy (colonoscopy or flexible sigmoidoscopy):  Excessive amounts of blood in the stool  Significant tenderness or worsening of abdominal pains  Swelling of the abdomen that is new, acute  Fever of 100F or higher  For urgent or emergent issues, a gastroenterologist can be reached at any hour by calling [phone]. Do not use MyChart messaging for urgent concerns.    DIET:  We do recommend a small meal at first, but then you may proceed to your regular diet.  Drink plenty of fluids but you should avoid alcoholic beverages for 24 hours.  ACTIVITY:  You should plan to take it easy for the rest of today and you should NOT DRIVE or  use heavy machinery until tomorrow (because of the sedation medicines used during the test).    FOLLOW UP: Our staff will call the number listed on your records 48-72 hours following your procedure to check on you and address any questions or concerns that you may have regarding the information given to you following your procedure. If we do not reach you, we will leave a message.  We will attempt to reach you two times.  During this call, we will ask if you have developed any symptoms of COVID 19. If you develop any symptoms (ie: fever, flu-like symptoms, shortness of breath, cough etc.) before then, please call [phone].  If you test positive for Covid 19 in the 2 weeks post procedure, please call and report this information to [place].     SIGNATURES/CONFIDENTIALITY: You and/or your care partner have signed paperwork which will be entered into your electronic medical record.  These signatures attest to the fact that that the information above on your After Visit Summary has been reviewed and is understood.  Full responsibility of the confidentiality of this discharge information lies with you and/or your care-partner.

## 2020-10-11 NOTE — Progress Notes (Signed)
Pt's states no medical or surgical changes since previsit or office visit. 

## 2020-10-15 ENCOUNTER — Telehealth: Payer: Self-pay

## 2020-10-15 NOTE — Telephone Encounter (Signed)
  Follow up Call-  Call back number 10/11/2020  Post procedure Call Back phone  # [phone]  Permission to leave phone message Yes  Some recent data might be hidden     Patient questions:  Do you have a fever, pain , or abdominal swelling? No. Pain Score  0 *  Have you tolerated food without any problems? Yes.    Have you been able to return to your normal activities? Yes.    Do you have any questions about your discharge instructions: Diet   No. Medications  No. Follow up visit  No.  Do you have questions or concerns about your Care? No.  Actions: * If pain score is 4 or above: No action needed, pain <4.  1. Have you developed a fever since your procedure? no  2.   Have you had an respiratory symptoms (SOB or cough) since your procedure? no  3.   Have you tested positive for COVID 19 since your procedure no  4.   Have you had any family members/close contacts diagnosed with the COVID 19 since your procedure?  no   If yes to any of these questions please route to Joylene John, RN and Joella Prince, RN

## 2020-12-03 ENCOUNTER — Telehealth: Payer: Self-pay | Admitting: Family Medicine

## 2020-12-03 ENCOUNTER — Telehealth: Payer: Self-pay

## 2020-12-03 MED ORDER — ZOLPIDEM TARTRATE 5 MG PO TABS: 5.0000 mg | ORAL_TABLET | Freq: Every evening | ORAL | 0 refills | Status: DC | PRN

## 2020-12-03 NOTE — Telephone Encounter (Signed)
Please let pt know prescription was sent.

## 2020-12-03 NOTE — Telephone Encounter (Signed)
Notified pt that Rx was sent to pharmacy per Dr. Birdie Riddle.

## 2020-12-03 NOTE — Telephone Encounter (Signed)
Patient called stating that she is traveling over the holidays and needs a script for [place]. This med is neither on her current or past med list. Please advise

## 2020-12-03 NOTE — Telephone Encounter (Signed)
Notified pt that Rx was sent to pharmacy. No further concerns at this time.

## 2020-12-03 NOTE — Telephone Encounter (Signed)
Called pt and she stated that she has taken this before and never experienced any side effects.

## 2020-12-03 NOTE — Telephone Encounter (Signed)
Please ask pt if this is something she has taken before as it has the potential for some serious side effects (sleep walking, eating, cooking, driving, etc)

## 2020-12-03 NOTE — Telephone Encounter (Signed)
Pt called in stating that she will be traveling over the holidays she wanted to know if Dr. Birdie Riddle would call him in a script of Ambien. This medication is not listed on her past meds list or current meds list.  Please advise

## 2020-12-26 ENCOUNTER — Encounter: Payer: 59 | Admitting: Family Medicine

## 2021-01-03 ENCOUNTER — Encounter: Payer: 59 | Admitting: Family Medicine

## 2021-01-14 ENCOUNTER — Ambulatory Visit: Payer: 59 | Admitting: Psychology

## 2021-01-31 DIAGNOSIS — M26609 Unspecified temporomandibular joint disorder, unspecified side: Secondary | ICD-10-CM | POA: Diagnosis not present

## 2021-02-05 ENCOUNTER — Ambulatory Visit (INDEPENDENT_AMBULATORY_CARE_PROVIDER_SITE_OTHER): Payer: 59 | Admitting: Psychology

## 2021-02-05 DIAGNOSIS — M26609 Unspecified temporomandibular joint disorder, unspecified side: Secondary | ICD-10-CM | POA: Diagnosis not present

## 2021-02-05 DIAGNOSIS — F4323 Adjustment disorder with mixed anxiety and depressed mood: Secondary | ICD-10-CM

## 2021-02-13 ENCOUNTER — Ambulatory Visit (INDEPENDENT_AMBULATORY_CARE_PROVIDER_SITE_OTHER): Payer: 59 | Admitting: Family Medicine

## 2021-02-13 ENCOUNTER — Encounter: Payer: Self-pay | Admitting: Family Medicine

## 2021-02-13 ENCOUNTER — Other Ambulatory Visit: Payer: 59

## 2021-02-13 ENCOUNTER — Other Ambulatory Visit: Payer: Self-pay

## 2021-02-13 VITALS — BP 110/70 | HR 70 | Temp 99.0°F | Resp 17 | Ht 65.0 in | Wt 131.0 lb

## 2021-02-13 DIAGNOSIS — D649 Anemia, unspecified: Secondary | ICD-10-CM | POA: Diagnosis not present

## 2021-02-13 DIAGNOSIS — Z0000 Encounter for general adult medical examination without abnormal findings: Secondary | ICD-10-CM | POA: Diagnosis not present

## 2021-02-13 DIAGNOSIS — E559 Vitamin D deficiency, unspecified: Secondary | ICD-10-CM

## 2021-02-13 LAB — BASIC METABOLIC PANEL
BUN: 12 mg/dL (ref 6–23)
CO2: 27 mEq/L (ref 19–32)
Calcium: 9 mg/dL (ref 8.4–10.5)
Chloride: 106 mEq/L (ref 96–112)
Creatinine, Ser: 0.66 mg/dL (ref 0.40–1.20)
GFR: 102.19 mL/min (ref >60.00)
Glucose, Bld: 82 mg/dL (ref 70–99)
Potassium: 3.8 mEq/L (ref 3.5–5.1)
Sodium: 137 mEq/L (ref 135–145)

## 2021-02-13 LAB — CBC WITH DIFFERENTIAL/PLATELET
Absolute Monocytes: 289 cells/uL (ref 200–950)
Basophils Absolute: 18 cells/uL (ref 0–200)
Basophils Relative: 0.3 %
Eosinophils Absolute: 130 cells/uL (ref 15–500)
Eosinophils Relative: 2.2 %
HCT: 29.2 % — ABNORMAL LOW (ref 35.0–45.0)
Hemoglobin: 10.4 g/dL — ABNORMAL LOW (ref 11.7–15.5)
Lymphs Abs: 1322 cells/uL (ref 850–3900)
MCH: 34 pg — ABNORMAL HIGH (ref 27.0–33.0)
MCHC: 35.6 g/dL (ref 32.0–36.0)
MCV: 95.4 fL (ref 80.0–100.0)
MPV: 11.1 fL (ref 7.5–12.5)
Monocytes Relative: 4.9 %
Neutro Abs: 4142 cells/uL (ref 1500–7800)
Neutrophils Relative %: 70.2 %
Platelets: 156 10*3/uL (ref 140–400)
RBC: 3.06 10*6/uL — ABNORMAL LOW (ref 3.80–5.10)
RDW: 16.2 % — ABNORMAL HIGH (ref 11.0–15.0)
Total Lymphocyte: 22.4 %
WBC: 5.9 10*3/uL (ref 3.8–10.8)

## 2021-02-13 LAB — LIPID PANEL
Cholesterol: 120 mg/dL (ref 0–200)
HDL: 56.9 mg/dL (ref >39.00)
LDL Cholesterol: 54 mg/dL (ref 0–99)
NonHDL: 63.56
Total CHOL/HDL Ratio: 2
Triglycerides: 48 mg/dL (ref 0.0–149.0)
VLDL: 9.6 mg/dL (ref 0.0–40.0)

## 2021-02-13 LAB — HEPATIC FUNCTION PANEL
ALT: 14 U/L (ref 0–35)
AST: 17 U/L (ref 0–37)
Albumin: 4.3 g/dL (ref 3.5–5.2)
Alkaline Phosphatase: 40 U/L (ref 39–117)
Bilirubin, Direct: 0.3 mg/dL (ref 0.0–0.3)
Total Bilirubin: 1.4 mg/dL — ABNORMAL HIGH (ref 0.2–1.2)
Total Protein: 6.9 g/dL (ref 6.0–8.3)

## 2021-02-13 MED ORDER — CYCLOBENZAPRINE HCL 10 MG PO TABS
10.0000 mg | ORAL_TABLET | Freq: Every day | ORAL | 0 refills | Status: DC
Start: 2021-02-13 — End: 2021-04-30

## 2021-02-13 NOTE — Progress Notes (Signed)
   Subjective:    Patient ID: Leah Willis, female    DOB: Apr 14, 1971, 50 y.o.   MRN: [id number]  HPI CPE- UTD on COVID, pap, Tdap, colonoscopy.  Pt reports mammo through Dr Corinna Capra.  Declines flu.  Reviewed past medical, surgical, family and social histories.   Health Maintenance  Topic Date Due  . INFLUENZA VACCINE  03/14/2021 (Originally 07/15/2020)  . MAMMOGRAM  08/05/2021 (Originally 02/16/2020)  . COVID-19 Vaccine (3 - Booster for Pfizer series) 08/05/2021 (Originally 07/29/2020)  . Hepatitis C Screening  02/13/2022 (Originally 08/23/1971)  . HIV Screening  02/13/2022 (Originally 07/25/1985)  . PAP SMEAR-Modifier  03/06/2022  . TETANUS/TDAP  09/26/2024  . COLONOSCOPY (Pts 45-48yrs Insurance coverage will need to be confirmed)  10/11/2030  . HPV VACCINES  Aged Out    Patient Care Team    Relationship Specialty Notifications Start End  Midge Minium, MD PCP - General Family Medicine  11/25/16   Louretta Shorten, MD Consulting Physician Obstetrics and Gynecology  08/05/16      Review of Systems Patient reports no vision/ hearing changes, adenopathy,fever, weight change,  persistant/recurrent hoarseness , swallowing issues, chest pain, palpitations, edema, persistant/recurrent cough, hemoptysis, dyspnea (rest/exertional/paroxysmal nocturnal), gastrointestinal bleeding (melena, rectal bleeding), abdominal pain, significant heartburn, bowel changes, GU symptoms (dysuria, hematuria, incontinence), Gyn symptoms (abnormal  bleeding, pain),  syncope, focal weakness, memory loss, numbness & tingling, skin/hair/nail changes, abnormal bruising or bleeding, anxiety, or depression.   + tooth grinding at night  This visit occurred during the SARS-CoV-2 public health emergency.  Safety protocols were in place, including screening questions prior to the visit, additional usage of staff PPE, and extensive cleaning of exam room while observing appropriate contact time as indicated for disinfecting  solutions.       Objective:   Physical Exam General Appearance:    Alert, cooperative, no distress, appears stated age  Head:    Normocephalic, without obvious abnormality, atraumatic  Eyes:    PERRL, conjunctiva/corneas clear, EOM's intact, fundi    benign, both eyes  Ears:    Normal TM's and external ear canals, both ears  Nose:   Deferred due to COVID  Throat:   Neck:   Supple, symmetrical, trachea midline, no adenopathy;    Thyroid: no enlargement/tenderness/nodules  Back:     Symmetric, no curvature, ROM normal, no CVA tenderness  Lungs:     Clear to auscultation bilaterally, respirations unlabored  Chest Wall:    No tenderness or deformity   Heart:    Regular rate and rhythm, S1 and S2 normal, no murmur, rub   or gallop  Breast Exam:    Deferred to GYN  Abdomen:     Soft, non-tender, bowel sounds active all four quadrants,    no masses, no organomegaly  Genitalia:    Deferred to GYN  Rectal:    Extremities:   Extremities normal, atraumatic, no cyanosis or edema  Pulses:   2+ and symmetric all extremities  Skin:   Skin color, texture, turgor normal, no rashes or lesions  Lymph nodes:   Cervical, supraclavicular, and axillary nodes normal  Neurologic:   CNII-XII intact, normal strength, sensation and reflexes    throughout          Assessment & Plan:

## 2021-02-13 NOTE — Assessment & Plan Note (Signed)
Pt has a hx of this.  Check labs.  Treat if needed

## 2021-02-13 NOTE — Assessment & Plan Note (Signed)
Pt has hx of similar.  Check labs and replete prn. °

## 2021-02-13 NOTE — Patient Instructions (Signed)
Follow up in 1 year or as needed We'll notify you of your lab results and make any changes if needed Keep up the good work on healthy diet and regular exercise- you look great! Have [facility] send me a copy of your mammo Call with any questions or concerns Stay Safe!  Stay Healthy!!

## 2021-02-13 NOTE — Assessment & Plan Note (Signed)
Pt's PE WNL.  UTD on pap, mammo, colonoscopy.  UTD on Tdap, COVID.  Declines flu.  Check labs.  Anticipatory guidance provided.

## 2021-02-14 LAB — VITAMIN D 25 HYDROXY (VIT D DEFICIENCY, FRACTURES): VITD: 49.31 ng/mL (ref 30.00–100.00)

## 2021-02-14 LAB — TSH: TSH: 1.16 u[IU]/mL (ref 0.35–4.50)

## 2021-02-27 ENCOUNTER — Ambulatory Visit (INDEPENDENT_AMBULATORY_CARE_PROVIDER_SITE_OTHER): Payer: 59 | Admitting: Psychology

## 2021-02-27 DIAGNOSIS — F4323 Adjustment disorder with mixed anxiety and depressed mood: Secondary | ICD-10-CM | POA: Diagnosis not present

## 2021-03-12 ENCOUNTER — Ambulatory Visit (INDEPENDENT_AMBULATORY_CARE_PROVIDER_SITE_OTHER): Payer: 59 | Admitting: Psychology

## 2021-03-12 DIAGNOSIS — F4323 Adjustment disorder with mixed anxiety and depressed mood: Secondary | ICD-10-CM | POA: Diagnosis not present

## 2021-03-13 ENCOUNTER — Ambulatory Visit: Payer: 59 | Admitting: Psychology

## 2021-04-09 ENCOUNTER — Ambulatory Visit: Payer: 59 | Admitting: Psychology

## 2021-04-26 ENCOUNTER — Telehealth: Payer: Self-pay | Admitting: Family Medicine

## 2021-04-26 NOTE — Telephone Encounter (Signed)
The next step would be to consult with her dentist regarding a possible oral appliance

## 2021-04-26 NOTE — Telephone Encounter (Signed)
Pt called in stating she is still having TMJ pain. She has been applying ice, taking the muscle relaxer that was prescribed by [facility] and naproxen. She wanted to know what should be the next steps.   Please advise  Pt can be reached at the home #

## 2021-04-26 NOTE — Telephone Encounter (Signed)
Called patient. She states she has already reached out to her dentist. They made her a mouth guard but she states that doesn't help. I asked  her if she has told them and she states yes. I also asked if she has tried getting a referral from them to someone who specializes in TMJ. Patient states she will call them and ask for a referral.

## 2021-04-30 ENCOUNTER — Other Ambulatory Visit: Payer: Self-pay | Admitting: Family Medicine

## 2021-04-30 MED ORDER — CYCLOBENZAPRINE HCL 10 MG PO TABS: 10.0000 mg | ORAL_TABLET | Freq: Every day | ORAL | 0 refills | Status: DC

## 2021-04-30 NOTE — Telephone Encounter (Signed)
Patient is requesting a refill of the following medications: Requested Prescriptions   Pending Prescriptions Disp Refills  . cyclobenzaprine (FLEXERIL) 10 MG tablet 30 tablet 0    Sig: Take 1 tablet (10 mg total) by mouth at bedtime.    Date of patient request: 04/30/21 Last office visit: 02/13/21 Date of last refill: 02/13/21 Last refill amount: 30 Follow up time period per chart: 1 year

## 2021-04-30 NOTE — Telephone Encounter (Signed)
..  Medication Refills  Last OV:  Medication:  cyclobenzapr   Pharmacy:[facility] - [address], [place]  Let patient know to contact pharmacy at the end of the day to make sure medication is ready.   Please notify patient to allow 48-72 hours to process.  Encourage patient to contact the pharmacy for refills or they can request refills through [facility] out below:   Last refill:  QTY:  Refill Date:    Other Comments:   Okay for refill?  Please advise.

## 2021-06-12 ENCOUNTER — Encounter: Payer: Self-pay | Admitting: *Deleted

## 2021-09-13 DIAGNOSIS — H5213 Myopia, bilateral: Secondary | ICD-10-CM | POA: Diagnosis not present

## 2021-09-13 DIAGNOSIS — H524 Presbyopia: Secondary | ICD-10-CM | POA: Diagnosis not present

## 2021-09-19 DIAGNOSIS — Z1231 Encounter for screening mammogram for malignant neoplasm of breast: Secondary | ICD-10-CM | POA: Diagnosis not present

## 2021-09-19 DIAGNOSIS — Z01419 Encounter for gynecological examination (general) (routine) without abnormal findings: Secondary | ICD-10-CM | POA: Diagnosis not present

## 2021-09-19 DIAGNOSIS — Z6822 Body mass index (BMI) 22.0-22.9, adult: Secondary | ICD-10-CM | POA: Diagnosis not present

## 2021-09-25 ENCOUNTER — Other Ambulatory Visit: Payer: Self-pay | Admitting: Obstetrics and Gynecology

## 2021-09-25 DIAGNOSIS — R928 Other abnormal and inconclusive findings on diagnostic imaging of breast: Secondary | ICD-10-CM

## 2021-10-02 ENCOUNTER — Ambulatory Visit
Admission: RE | Admit: 2021-10-02 | Discharge: 2021-10-02 | Disposition: A | Discharge status: Home / Self Care | Payer: 59 | Source: Ambulatory Visit | Attending: Obstetrics and Gynecology | Admitting: Obstetrics and Gynecology

## 2021-10-02 ENCOUNTER — Ambulatory Visit: Payer: 59

## 2021-10-02 ENCOUNTER — Other Ambulatory Visit: Payer: Self-pay

## 2021-10-02 DIAGNOSIS — R928 Other abnormal and inconclusive findings on diagnostic imaging of breast: Secondary | ICD-10-CM | POA: Diagnosis not present

## 2021-10-02 DIAGNOSIS — N6001 Solitary cyst of right breast: Secondary | ICD-10-CM | POA: Diagnosis not present

## 2021-10-09 ENCOUNTER — Other Ambulatory Visit: Payer: 59

## 2022-02-06 ENCOUNTER — Other Ambulatory Visit: Payer: Self-pay

## 2022-02-06 ENCOUNTER — Ambulatory Visit: Payer: 59 | Admitting: Physician Assistant

## 2022-02-06 DIAGNOSIS — C44311 Basal cell carcinoma of skin of nose: Secondary | ICD-10-CM

## 2022-02-06 DIAGNOSIS — Z1283 Encounter for screening for malignant neoplasm of skin: Secondary | ICD-10-CM | POA: Diagnosis not present

## 2022-02-06 DIAGNOSIS — D489 Neoplasm of uncertain behavior, unspecified: Secondary | ICD-10-CM

## 2022-02-06 DIAGNOSIS — C44310 Basal cell carcinoma of skin of unspecified parts of face: Secondary | ICD-10-CM

## 2022-02-06 NOTE — Patient Instructions (Signed)

## 2022-02-10 NOTE — Progress Notes (Signed)
Tx with biopsy. RTC if recurs.

## 2022-02-17 ENCOUNTER — Encounter: Payer: Self-pay | Admitting: Family Medicine

## 2022-02-17 ENCOUNTER — Telehealth: Payer: Self-pay

## 2022-02-17 ENCOUNTER — Ambulatory Visit (INDEPENDENT_AMBULATORY_CARE_PROVIDER_SITE_OTHER): Payer: 59 | Admitting: Family Medicine

## 2022-02-17 VITALS — BP 112/78 | HR 69 | Temp 97.2°F | Resp 16 | Ht 65.0 in | Wt 135.4 lb

## 2022-02-17 DIAGNOSIS — Z1159 Encounter for screening for other viral diseases: Secondary | ICD-10-CM

## 2022-02-17 DIAGNOSIS — Z0000 Encounter for general adult medical examination without abnormal findings: Secondary | ICD-10-CM

## 2022-02-17 DIAGNOSIS — Z114 Encounter for screening for human immunodeficiency virus [HIV]: Secondary | ICD-10-CM

## 2022-02-17 DIAGNOSIS — D649 Anemia, unspecified: Secondary | ICD-10-CM

## 2022-02-17 DIAGNOSIS — E559 Vitamin D deficiency, unspecified: Secondary | ICD-10-CM | POA: Diagnosis not present

## 2022-02-17 LAB — LIPID PANEL
Cholesterol: 130 mg/dL (ref 0–200)
HDL: 54.7 mg/dL (ref >39.00)
LDL Cholesterol: 66 mg/dL (ref 0–99)
NonHDL: 74.9
Total CHOL/HDL Ratio: 2
Triglycerides: 46 mg/dL (ref 0.0–149.0)
VLDL: 9.2 mg/dL (ref 0.0–40.0)

## 2022-02-17 LAB — HEPATIC FUNCTION PANEL
ALT: 12 U/L (ref 0–35)
AST: 14 U/L (ref 0–37)
Albumin: 4.6 g/dL (ref 3.5–5.2)
Alkaline Phosphatase: 34 U/L — ABNORMAL LOW (ref 39–117)
Bilirubin, Direct: 0.3 mg/dL (ref 0.0–0.3)
Total Bilirubin: 1.5 mg/dL — ABNORMAL HIGH (ref 0.2–1.2)
Total Protein: 6.8 g/dL (ref 6.0–8.3)

## 2022-02-17 LAB — BASIC METABOLIC PANEL
BUN: 14 mg/dL (ref 6–23)
CO2: 25 mEq/L (ref 19–32)
Calcium: 9.2 mg/dL (ref 8.4–10.5)
Chloride: 106 mEq/L (ref 96–112)
Creatinine, Ser: 0.71 mg/dL (ref 0.40–1.20)
GFR: 98.35 mL/min (ref >60.00)
Glucose, Bld: 98 mg/dL (ref 70–99)
Potassium: 4.3 mEq/L (ref 3.5–5.1)
Sodium: 140 mEq/L (ref 135–145)

## 2022-02-17 LAB — CBC WITH DIFFERENTIAL/PLATELET
Basophils Absolute: 0 10*3/uL (ref 0.0–0.1)
Basophils Relative: 0.6 % (ref 0.0–3.0)
Eosinophils Absolute: 0 10*3/uL (ref 0.0–0.7)
Eosinophils Relative: 0.8 % (ref 0.0–5.0)
HCT: 29.2 % — ABNORMAL LOW (ref 36.0–46.0)
Hemoglobin: 10.9 g/dL — ABNORMAL LOW (ref 12.0–15.0)
Lymphocytes Relative: 22 % (ref 12.0–46.0)
Lymphs Abs: 1.3 10*3/uL (ref 0.7–4.0)
MCHC: 37.3 g/dL (ref 30.0–36.0)
MCV: 91.7 fl (ref 78.0–100.0)
Monocytes Absolute: 0.3 10*3/uL (ref 0.1–1.0)
Monocytes Relative: 5.3 % (ref 3.0–12.0)
Neutro Abs: 4.1 10*3/uL (ref 1.4–7.7)
Neutrophils Relative %: 71.3 % (ref 43.0–77.0)
Platelets: 140 10*3/uL — ABNORMAL LOW (ref 150.0–400.0)
RBC: 3.19 Mil/uL — ABNORMAL LOW (ref 3.87–5.11)
RDW: 17.4 % — ABNORMAL HIGH (ref 11.5–15.5)
WBC: 5.7 10*3/uL (ref 4.0–10.5)

## 2022-02-17 LAB — VITAMIN D 25 HYDROXY (VIT D DEFICIENCY, FRACTURES): VITD: 47.29 ng/mL (ref 30.00–100.00)

## 2022-02-17 LAB — TSH: TSH: 0.91 u[IU]/mL (ref 0.35–5.50)

## 2022-02-17 NOTE — Patient Instructions (Signed)
Follow up in 1 year or as needed We'll notify you of your lab results and make any changes if needed Keep up the good work on healthy diet and regular exercise- you look great!!! Call with any questions or concerns Stay Safe!  Stay Healthy! Happy Spring!!! 

## 2022-02-17 NOTE — Assessment & Plan Note (Signed)
Pt has hx of anemia.  Check labs to determine if intervention is needed ?

## 2022-02-17 NOTE — Telephone Encounter (Signed)
Have reviewed this. Rest of CBC not available at this time unfortunately. She was clinically stable on exam today per Dr. Virgil Benedict note and has a history of anemia. Without any clinical concerns I do not think any emergent action needs to be taken tonight pending remainder of CBC findings. ? ?Thanks, ? ?Rich

## 2022-02-17 NOTE — Telephone Encounter (Signed)
[facility] called and states that the Patient MCHC of 37.3. ?

## 2022-02-17 NOTE — Assessment & Plan Note (Signed)
Check labs.  Replete prn. 

## 2022-02-17 NOTE — Assessment & Plan Note (Signed)
Pt's PE WNL.  UTD on pap, mammo, colonoscopy, Tdap.  Plans to get shingles vaccine but not today.  Check labs.  Anticipatory guidance provided.  ?

## 2022-02-17 NOTE — Progress Notes (Signed)
? ?  Subjective:  ? ? Patient ID: Leah Willis, female    DOB: 04-23-1971, 51 y.o.   MRN: [id number] ? ?HPI ?CPE- UTD on pap, mammo, colonoscopy, Tdap ? ?Patient Care Team  ?  Relationship Specialty Notifications Start End  ?Midge Minium, MD PCP - General Family Medicine  11/25/16   ?Louretta Shorten, MD Consulting Physician Obstetrics and Gynecology  08/05/16   ?Starlyn Skeans Physician Assistant Dermatology  02/06/22   ?  ?Health Maintenance  ?Topic Date Due  ? HIV Screening  Never done  ? Hepatitis C Screening  Never done  ? COVID-19 Vaccine (5 - Booster) 03/26/2020  ? Zoster Vaccines- Shingrix (1 of 2) Never done  ? INFLUENZA VACCINE  Never done  ? PAP SMEAR-Modifier  03/06/2022  ? MAMMOGRAM  10/02/2022  ? TETANUS/TDAP  09/26/2024  ? COLONOSCOPY (Pts 45-71yr Insurance coverage will need to be confirmed)  10/11/2030  ? HPV VACCINES  Aged Out  ?  ? ? ?Review of Systems ?Patient reports no vision/ hearing changes, adenopathy,fever, weight change,  persistant/recurrent hoarseness , swallowing issues, chest pain, palpitations, edema, persistant/recurrent cough, hemoptysis, dyspnea (rest/exertional/paroxysmal nocturnal), gastrointestinal bleeding (melena, rectal bleeding), abdominal pain, significant heartburn, bowel changes, GU symptoms (dysuria, hematuria, incontinence), Gyn symptoms (abnormal  bleeding, pain),  syncope, focal weakness, memory loss, numbness & tingling, skin/hair/nail changes, abnormal bruising or bleeding, anxiety, or depression.  ? ?This visit occurred during the SARS-CoV-2 public health emergency.  Safety protocols were in place, including screening questions prior to the visit, additional usage of staff PPE, and extensive cleaning of exam room while observing appropriate contact time as indicated for disinfecting solutions.   ?   ?Objective:  ? Physical Exam ?General Appearance:    Alert, cooperative, no distress, appears stated age  ?Head:    Normocephalic, without obvious  abnormality, atraumatic  ?Eyes:    PERRL, conjunctiva/corneas clear, EOM's intact, fundi  ?  benign, both eyes  ?Ears:    Normal TM's and external ear canals, both ears  ?Nose:   Deferred due to COVID  ?Throat:   ?Neck:   Supple, symmetrical, trachea midline, no adenopathy;  ?  Thyroid: no enlargement/tenderness/nodules  ?Back:     Symmetric, no curvature, ROM normal, no CVA tenderness  ?Lungs:     Clear to auscultation bilaterally, respirations unlabored  ?Chest Wall:    No tenderness or deformity  ? Heart:    Regular rate and rhythm, S1 and S2 normal, no murmur, rub ?  or gallop  ?Breast Exam:    Deferred to GYN  ?Abdomen:     Soft, non-tender, bowel sounds active all four quadrants,  ?  no masses, no organomegaly  ?Genitalia:    Deferred to GYN  ?Rectal:    ?Extremities:   Extremities normal, atraumatic, no cyanosis or edema  ?Pulses:   2+ and symmetric all extremities  ?Skin:   Skin color, texture, turgor normal, no rashes or lesions  ?Lymph nodes:   Cervical, supraclavicular, and axillary nodes normal  ?Neurologic:   CNII-XII intact, normal strength, sensation and reflexes  ?  throughout  ?  ? ? ? ?   ?Assessment & Plan:  ? ? ?

## 2022-02-18 ENCOUNTER — Telehealth: Payer: Self-pay

## 2022-02-18 LAB — HEPATITIS C ANTIBODY
Hepatitis C Ab: NONREACTIVE
SIGNAL TO CUT-OFF: <0.02 (ref <1.00)

## 2022-02-18 LAB — HIV ANTIBODY (ROUTINE TESTING W REFLEX): HIV 1&2 Ab, 4th Generation: NONREACTIVE

## 2022-02-18 NOTE — Telephone Encounter (Signed)
Patient aware.

## 2022-02-18 NOTE — Telephone Encounter (Signed)
-----   Message from Midge Minium, MD sent at 02/18/2022  7:29 AM EST ----- ?Labs are all stable and look good.  No changes at this time ?

## 2022-03-03 ENCOUNTER — Encounter: Payer: Self-pay | Admitting: Physician Assistant

## 2022-03-03 NOTE — Progress Notes (Signed)
° °  New Patient   Subjective  Leah Willis is a 51 y.o. female who presents for the following: New Patient (Initial Visit) (Patient here today for lesion on the tip of her nose x 6 months no bleeding. Per patient the lesion does blister, no pain. No personal history or family history of atypical moles, melanoma or non mole skin cancer. ).   The following portions of the chart were reviewed this encounter and updated as appropriate:  Tobacco  Allergies  Meds  Problems  Med Hx  Surg Hx  Fam Hx      Objective  Well appearing patient in no apparent distress; mood and affect are within normal limits.  A full examination was performed including scalp, head, eyes, ears, nose, lips, neck, chest, axillae, abdomen, back, buttocks, bilateral upper extremities, bilateral lower extremities, hands, feet, fingers, toes, fingernails, and toenails. All findings within normal limits unless otherwise noted below.  Mid Tip of Nose Pearly papule with telangectasia.         Assessment & Plan  BCC (basal cell carcinoma), face Mid Tip of Nose  Skin / nail biopsy Type of biopsy: tangential   Informed consent: discussed and consent obtained   Timeout: patient name, date of birth, surgical site, and procedure verified   Anesthesia: the lesion was anesthetized in a standard fashion   Anesthetic:  1% lidocaine w/ epinephrine 1-100,000 local infiltration Instrument used: flexible razor blade   Hemostasis achieved with: aluminum chloride and electrodesiccation   Outcome: patient tolerated procedure well   Post-procedure details: wound care instructions given    Destruction of lesion Complexity: simple   Destruction method: electrodesiccation and curettage   Informed consent: discussed and consent obtained   Timeout:  patient name, date of birth, surgical site, and procedure verified Anesthesia: the lesion was anesthetized in a standard fashion   Anesthetic:  1% lidocaine w/ epinephrine  1-100,000 local infiltration Curettage performed in three different directions: Yes   Electrodesiccation performed over the curetted area: Yes   Curettage cycles:  3 Margin per side (cm):  0.1 Final wound size (cm):  0.5 Hemostasis achieved with:  aluminum chloride Outcome: patient tolerated procedure well with no complications   Post-procedure details: wound care instructions given    Specimen 1 - Surgical pathology Differential Diagnosis: bcc vs [id number]  Check Margins: yes     I, Dyan Creelman, PA-C, have reviewed all documentation's for this visit.  The documentation on 03/03/22 for the exam, diagnosis, procedures and orders are all accurate and complete.

## 2022-03-04 ENCOUNTER — Ambulatory Visit: Payer: 59 | Admitting: Dermatology

## 2022-04-14 IMAGING — MG DIGITAL DIAGNOSTIC BILAT W/ TOMO W/ CAD
8 series · 8 of 24 positions shown · non-contrast
Comparison: Previous exam(s).

CLINICAL DATA: 50-year-old female for further evaluation of
possible RIGHT breast mass and possible LEFT breast asymmetry on
screening mammogram.

EXAM:
DIGITAL DIAGNOSTIC BILATERAL MAMMOGRAM WITH TOMOSYNTHESIS AND CAD;
ULTRASOUND RIGHT BREAST LIMITED
TECHNIQUE: Bilateral digital diagnostic mammography and breast tomosynthesis
was performed. The images were evaluated with computer-aided
detection.; Targeted ultrasound examination of the right breast was
performed

[L ML synth-2D]
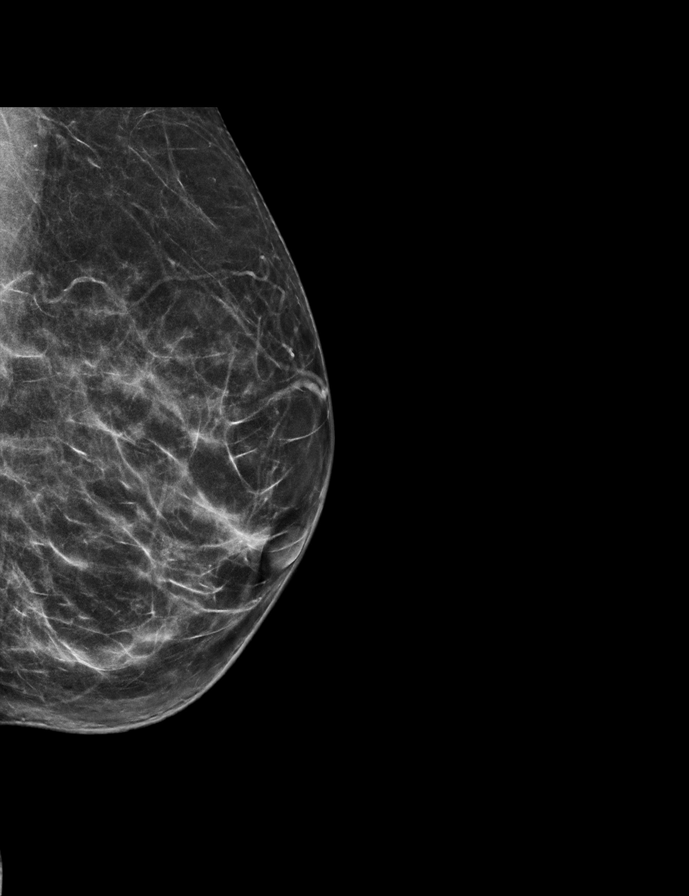

[R CC synth-2D]
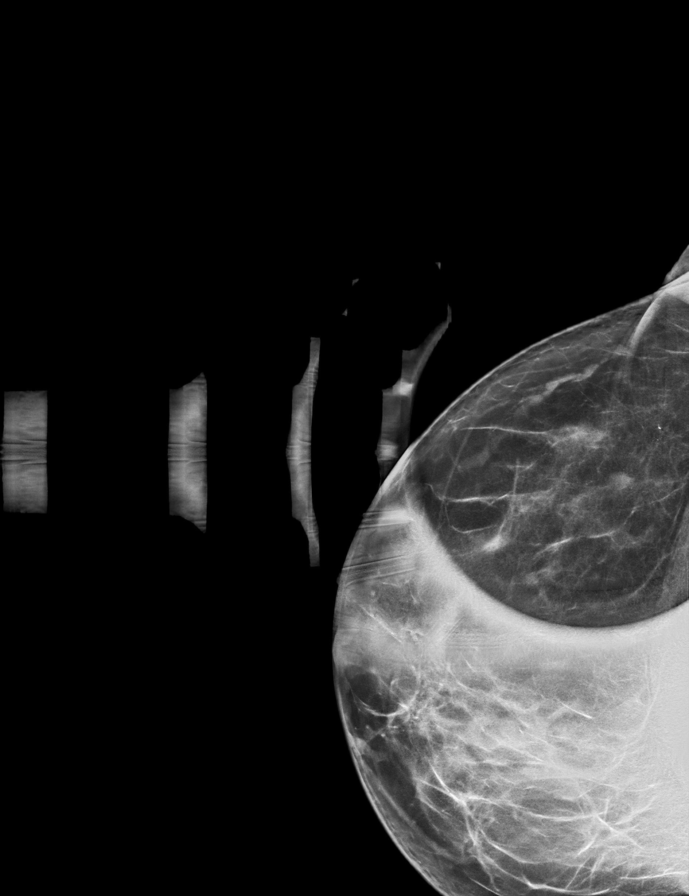

[R MLO synth-2D]
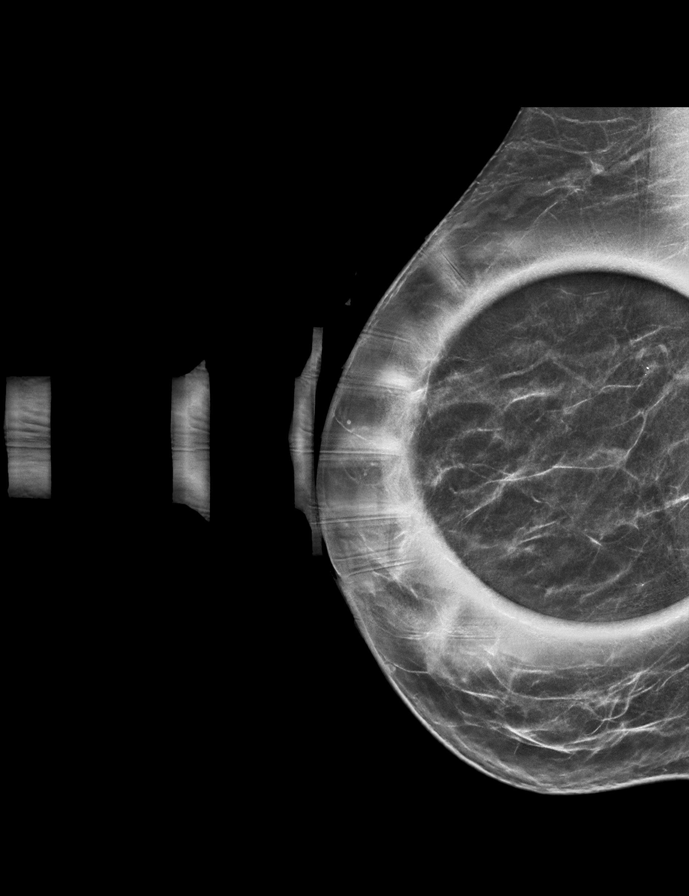

[L MLO synth-2D]
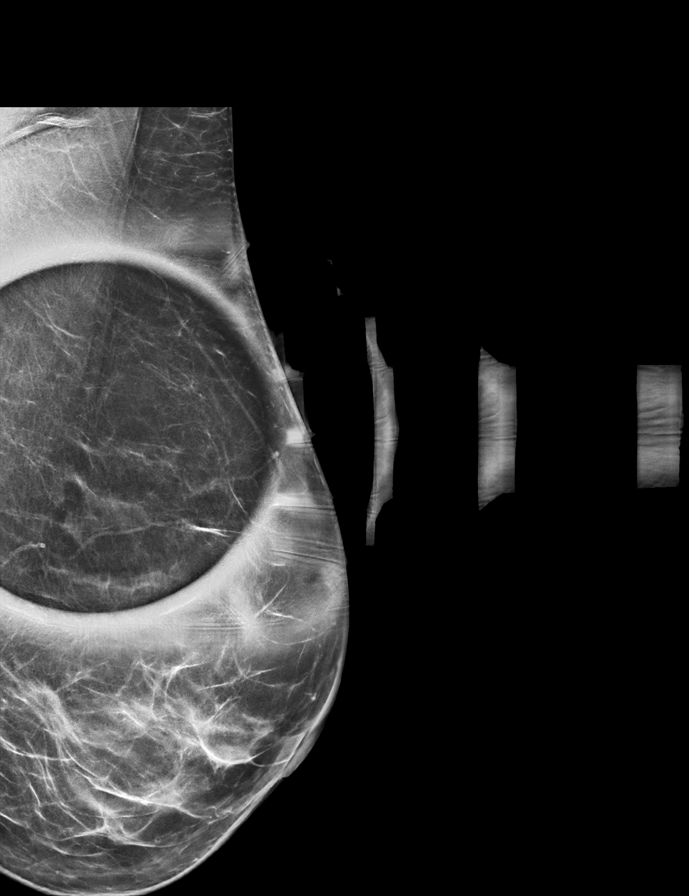

[L ML tomo · tomo slice 30/59.0]
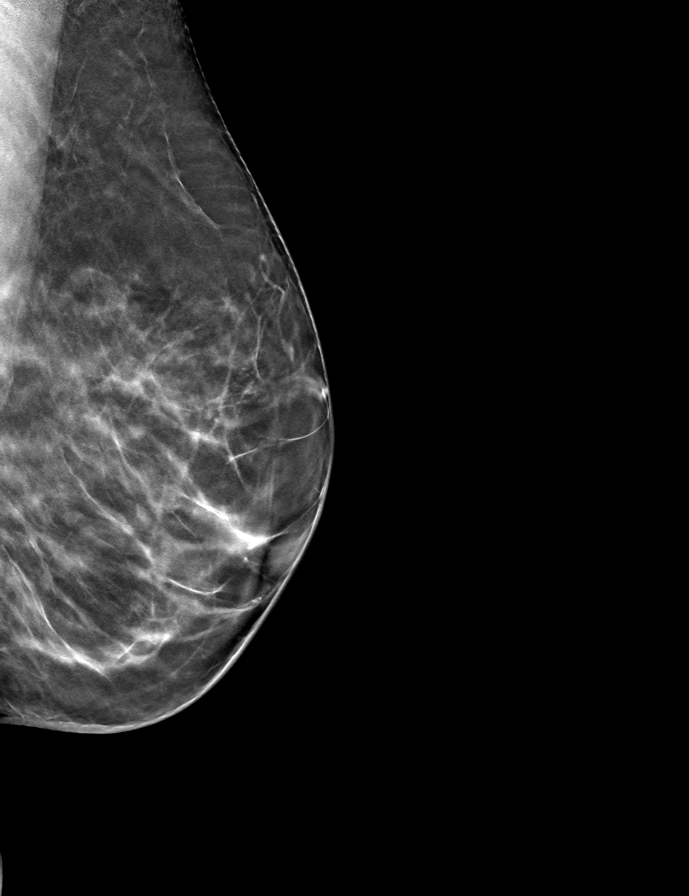

[R MLO tomo · tomo slice 30/59.0]
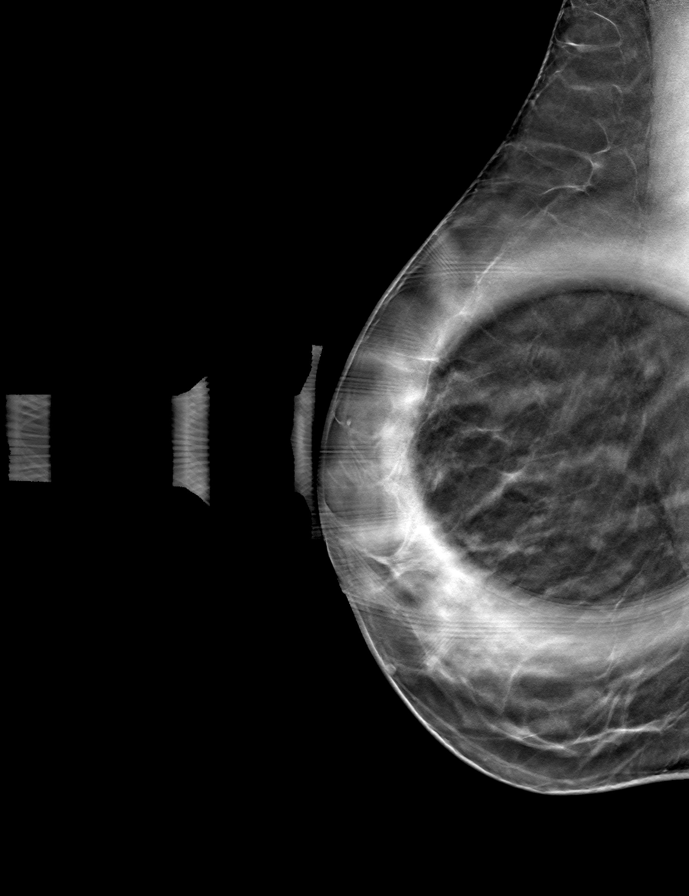

[L MLO tomo · tomo slice 30/59.0]
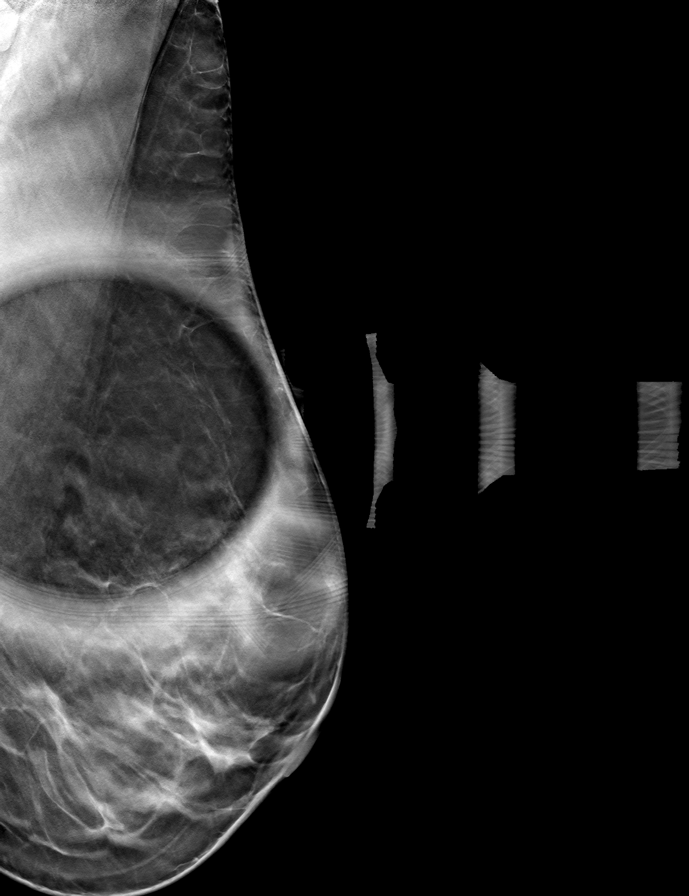

[R CC tomo · tomo slice 31/61.0]
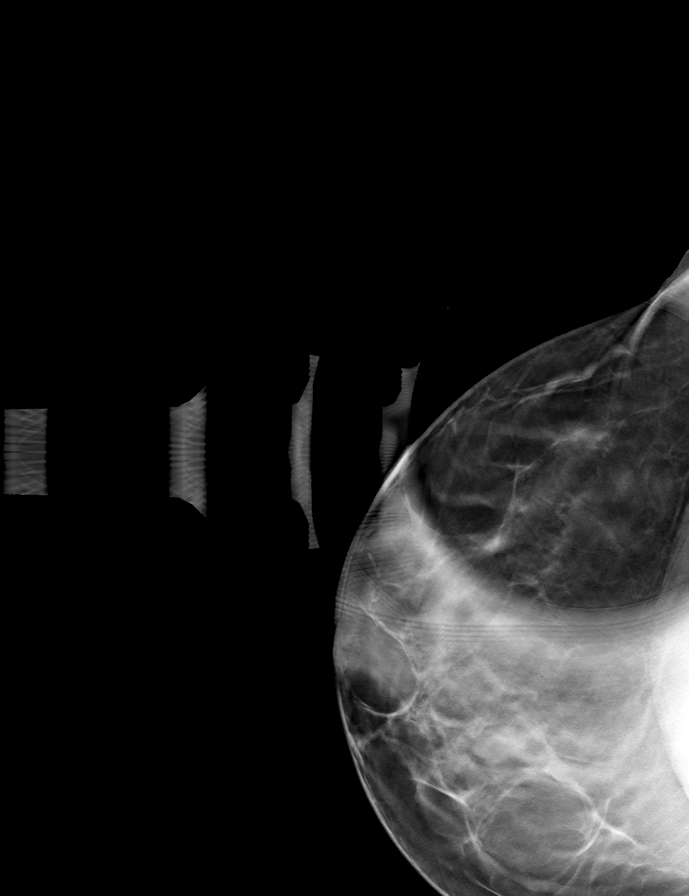

[8 of 24 positions shown; findings below may reference images not displayed]

ACR Breast Density Category b: There are scattered areas of
fibroglandular density.
FINDINGS: 2D/3D spot compression views of both breasts and of full field view
of the LEFT breast are performed.

A persistent circumscribed oval mass within the OUTER RIGHT breast
is identified.

The UPPER LEFT breast screening study asymmetry disperses on spot
compression views without persistent mammographic abnormality in
this area.

Targeted ultrasound is performed, showing a 0.5 x 0.4 x 0.5 cm
benign cyst at the 8 o'clock position of the RIGHT breast 7 cm from
the nipple, corresponding to the RIGHT breast screening study
finding.
IMPRESSION: 1. Benign cyst within the OUTER RIGHT breast corresponding to the
RIGHT breast screening study finding.
2. No persistent mammographic abnormality at the site of the LEFT
breast screening study finding.

RECOMMENDATION:
Bilateral screening mammogram in 1 year.

I have discussed the findings and recommendations with the patient.
If applicable, a reminder letter will be sent to the patient
regarding the next appointment.

BI-RADS CATEGORY  2: Benign.

## 2022-05-19 DIAGNOSIS — M5136 Other intervertebral disc degeneration, lumbar region: Secondary | ICD-10-CM | POA: Diagnosis not present

## 2022-05-19 DIAGNOSIS — M9903 Segmental and somatic dysfunction of lumbar region: Secondary | ICD-10-CM | POA: Diagnosis not present

## 2022-05-21 DIAGNOSIS — M9903 Segmental and somatic dysfunction of lumbar region: Secondary | ICD-10-CM | POA: Diagnosis not present

## 2022-05-21 DIAGNOSIS — M5136 Other intervertebral disc degeneration, lumbar region: Secondary | ICD-10-CM | POA: Diagnosis not present

## 2022-05-26 DIAGNOSIS — M9903 Segmental and somatic dysfunction of lumbar region: Secondary | ICD-10-CM | POA: Diagnosis not present

## 2022-05-26 DIAGNOSIS — M5136 Other intervertebral disc degeneration, lumbar region: Secondary | ICD-10-CM | POA: Diagnosis not present

## 2022-05-28 DIAGNOSIS — M5136 Other intervertebral disc degeneration, lumbar region: Secondary | ICD-10-CM | POA: Diagnosis not present

## 2022-05-28 DIAGNOSIS — M9903 Segmental and somatic dysfunction of lumbar region: Secondary | ICD-10-CM | POA: Diagnosis not present

## 2022-05-29 DIAGNOSIS — M9903 Segmental and somatic dysfunction of lumbar region: Secondary | ICD-10-CM | POA: Diagnosis not present

## 2022-05-29 DIAGNOSIS — M5136 Other intervertebral disc degeneration, lumbar region: Secondary | ICD-10-CM | POA: Diagnosis not present

## 2022-06-02 DIAGNOSIS — M9903 Segmental and somatic dysfunction of lumbar region: Secondary | ICD-10-CM | POA: Diagnosis not present

## 2022-06-02 DIAGNOSIS — M5136 Other intervertebral disc degeneration, lumbar region: Secondary | ICD-10-CM | POA: Diagnosis not present

## 2022-06-05 DIAGNOSIS — M5136 Other intervertebral disc degeneration, lumbar region: Secondary | ICD-10-CM | POA: Diagnosis not present

## 2022-06-05 DIAGNOSIS — M9903 Segmental and somatic dysfunction of lumbar region: Secondary | ICD-10-CM | POA: Diagnosis not present

## 2022-06-09 DIAGNOSIS — M5136 Other intervertebral disc degeneration, lumbar region: Secondary | ICD-10-CM | POA: Diagnosis not present

## 2022-06-09 DIAGNOSIS — M9903 Segmental and somatic dysfunction of lumbar region: Secondary | ICD-10-CM | POA: Diagnosis not present

## 2022-06-12 DIAGNOSIS — M5136 Other intervertebral disc degeneration, lumbar region: Secondary | ICD-10-CM | POA: Diagnosis not present

## 2022-06-12 DIAGNOSIS — M9903 Segmental and somatic dysfunction of lumbar region: Secondary | ICD-10-CM | POA: Diagnosis not present

## 2022-06-18 DIAGNOSIS — M5136 Other intervertebral disc degeneration, lumbar region: Secondary | ICD-10-CM | POA: Diagnosis not present

## 2022-06-18 DIAGNOSIS — M9903 Segmental and somatic dysfunction of lumbar region: Secondary | ICD-10-CM | POA: Diagnosis not present

## 2022-06-26 DIAGNOSIS — M5136 Other intervertebral disc degeneration, lumbar region: Secondary | ICD-10-CM | POA: Diagnosis not present

## 2022-06-26 DIAGNOSIS — M9903 Segmental and somatic dysfunction of lumbar region: Secondary | ICD-10-CM | POA: Diagnosis not present

## 2022-07-01 DIAGNOSIS — M5136 Other intervertebral disc degeneration, lumbar region: Secondary | ICD-10-CM | POA: Diagnosis not present

## 2022-07-01 DIAGNOSIS — M9903 Segmental and somatic dysfunction of lumbar region: Secondary | ICD-10-CM | POA: Diagnosis not present

## 2022-07-15 DIAGNOSIS — M5136 Other intervertebral disc degeneration, lumbar region: Secondary | ICD-10-CM | POA: Diagnosis not present

## 2022-07-15 DIAGNOSIS — M9903 Segmental and somatic dysfunction of lumbar region: Secondary | ICD-10-CM | POA: Diagnosis not present

## 2022-07-31 DIAGNOSIS — M5136 Other intervertebral disc degeneration, lumbar region: Secondary | ICD-10-CM | POA: Diagnosis not present

## 2022-07-31 DIAGNOSIS — M9903 Segmental and somatic dysfunction of lumbar region: Secondary | ICD-10-CM | POA: Diagnosis not present

## 2022-11-17 DIAGNOSIS — Z1231 Encounter for screening mammogram for malignant neoplasm of breast: Secondary | ICD-10-CM | POA: Diagnosis not present

## 2022-11-17 DIAGNOSIS — Z01419 Encounter for gynecological examination (general) (routine) without abnormal findings: Secondary | ICD-10-CM | POA: Diagnosis not present

## 2022-11-17 DIAGNOSIS — Z124 Encounter for screening for malignant neoplasm of cervix: Secondary | ICD-10-CM | POA: Diagnosis not present

## 2022-11-17 DIAGNOSIS — Z6822 Body mass index (BMI) 22.0-22.9, adult: Secondary | ICD-10-CM | POA: Diagnosis not present

## 2022-11-19 ENCOUNTER — Other Ambulatory Visit: Payer: Self-pay | Admitting: Obstetrics and Gynecology

## 2022-11-19 DIAGNOSIS — R928 Other abnormal and inconclusive findings on diagnostic imaging of breast: Secondary | ICD-10-CM

## 2022-11-21 ENCOUNTER — Ambulatory Visit
Admission: RE | Admit: 2022-11-21 | Discharge: 2022-11-21 | Disposition: A | Discharge status: Home / Self Care | Payer: 59 | Source: Ambulatory Visit | Attending: Obstetrics and Gynecology | Admitting: Obstetrics and Gynecology

## 2022-11-21 DIAGNOSIS — R928 Other abnormal and inconclusive findings on diagnostic imaging of breast: Secondary | ICD-10-CM | POA: Diagnosis not present

## 2022-11-21 DIAGNOSIS — N6012 Diffuse cystic mastopathy of left breast: Secondary | ICD-10-CM | POA: Diagnosis not present

## 2022-12-05 DIAGNOSIS — M2011 Hallux valgus (acquired), right foot: Secondary | ICD-10-CM | POA: Diagnosis not present

## 2022-12-05 DIAGNOSIS — M79671 Pain in right foot: Secondary | ICD-10-CM | POA: Diagnosis not present

## 2022-12-11 ENCOUNTER — Other Ambulatory Visit (HOSPITAL_COMMUNITY): Payer: Self-pay | Admitting: Orthopedic Surgery

## 2023-01-01 ENCOUNTER — Other Ambulatory Visit: Payer: Self-pay

## 2023-01-01 ENCOUNTER — Encounter (HOSPITAL_BASED_OUTPATIENT_CLINIC_OR_DEPARTMENT_OTHER): Payer: Self-pay | Admitting: Orthopedic Surgery

## 2023-01-05 NOTE — Progress Notes (Signed)

## 2023-01-08 ENCOUNTER — Other Ambulatory Visit: Payer: Self-pay

## 2023-01-08 ENCOUNTER — Ambulatory Visit (HOSPITAL_BASED_OUTPATIENT_CLINIC_OR_DEPARTMENT_OTHER): Payer: Commercial Managed Care - PPO | Admitting: Certified Registered"

## 2023-01-08 ENCOUNTER — Encounter (HOSPITAL_BASED_OUTPATIENT_CLINIC_OR_DEPARTMENT_OTHER): Payer: Self-pay | Admitting: Orthopedic Surgery

## 2023-01-08 ENCOUNTER — Ambulatory Visit (HOSPITAL_BASED_OUTPATIENT_CLINIC_OR_DEPARTMENT_OTHER)
Admission: RE | Admit: 2023-01-08 | Discharge: 2023-01-08 | Disposition: A | Discharge status: Home / Self Care | Payer: Commercial Managed Care - PPO | Attending: Orthopedic Surgery | Admitting: Orthopedic Surgery

## 2023-01-08 ENCOUNTER — Ambulatory Visit (HOSPITAL_BASED_OUTPATIENT_CLINIC_OR_DEPARTMENT_OTHER): Payer: Commercial Managed Care - PPO

## 2023-01-08 ENCOUNTER — Encounter (HOSPITAL_BASED_OUTPATIENT_CLINIC_OR_DEPARTMENT_OTHER)
Admission: RE | Disposition: A | Discharge status: Home / Self Care | Payer: Self-pay | Source: Home / Self Care | Attending: Orthopedic Surgery

## 2023-01-08 DIAGNOSIS — M21611 Bunion of right foot: Secondary | ICD-10-CM | POA: Diagnosis not present

## 2023-01-08 DIAGNOSIS — Z01818 Encounter for other preprocedural examination: Secondary | ICD-10-CM

## 2023-01-08 DIAGNOSIS — M2011 Hallux valgus (acquired), right foot: Secondary | ICD-10-CM | POA: Diagnosis not present

## 2023-01-08 DIAGNOSIS — G8918 Other acute postprocedural pain: Secondary | ICD-10-CM | POA: Diagnosis not present

## 2023-01-08 HISTORY — PX: BUNIONECTOMY: SHX129

## 2023-01-08 LAB — POCT PREGNANCY, URINE: Preg Test, Ur: NEGATIVE

## 2023-01-08 SURGERY — BUNIONECTOMY
Anesthesia: General | Site: Foot | Laterality: Right

## 2023-01-08 MED ORDER — CEFAZOLIN SODIUM-DEXTROSE 2-4 GM/100ML-% IV SOLN
2.0000 g | INTRAVENOUS | Status: AC
  Administered 2023-01-08: 2 g via INTRAVENOUS

## 2023-01-08 MED ORDER — LIDOCAINE HCL (CARDIAC) PF 100 MG/5ML IV SOSY
PREFILLED_SYRINGE | INTRAVENOUS | Status: DC | PRN
  Administered 2023-01-08: 30 mg via INTRAVENOUS

## 2023-01-08 MED ORDER — MEPERIDINE HCL 25 MG/ML IJ SOLN: 6.2500 mg | INTRAMUSCULAR | Status: DC | PRN

## 2023-01-08 MED ORDER — SENNA 8.6 MG PO TABS: 2.0000 | ORAL_TABLET | Freq: Two times a day (BID) | ORAL | 0 refills | Status: DC

## 2023-01-08 MED ORDER — FENTANYL CITRATE (PF) 100 MCG/2ML IJ SOLN
INTRAMUSCULAR | Status: AC
  Filled 2023-01-08: qty 2

## 2023-01-08 MED ORDER — DOCUSATE SODIUM 100 MG PO CAPS: 100.0000 mg | ORAL_CAPSULE | Freq: Two times a day (BID) | ORAL | 0 refills | Status: DC

## 2023-01-08 MED ORDER — ONDANSETRON HCL 4 MG/2ML IJ SOLN
INTRAMUSCULAR | Status: DC | PRN
  Administered 2023-01-08: 4 mg via INTRAVENOUS

## 2023-01-08 MED ORDER — BUPIVACAINE HCL (PF) 0.25 % IJ SOLN
INTRAMUSCULAR | Status: AC
  Filled 2023-01-08: qty 90

## 2023-01-08 MED ORDER — PHENYLEPHRINE HCL (PRESSORS) 10 MG/ML IV SOLN
INTRAVENOUS | Status: DC | PRN
  Administered 2023-01-08: 40 ug via INTRAVENOUS

## 2023-01-08 MED ORDER — SODIUM CHLORIDE 0.9 % IV SOLN: INTRAVENOUS | Status: DC

## 2023-01-08 MED ORDER — MIDAZOLAM HCL 2 MG/2ML IJ SOLN
2.0000 mg | Freq: Once | INTRAMUSCULAR | Status: AC
  Administered 2023-01-08: 1 mg via INTRAVENOUS

## 2023-01-08 MED ORDER — AMISULPRIDE (ANTIEMETIC) 5 MG/2ML IV SOLN: 10.0000 mg | Freq: Once | INTRAVENOUS | Status: DC | PRN

## 2023-01-08 MED ORDER — FENTANYL CITRATE (PF) 100 MCG/2ML IJ SOLN: 25.0000 ug | INTRAMUSCULAR | Status: DC | PRN

## 2023-01-08 MED ORDER — ROPIVACAINE HCL 5 MG/ML IJ SOLN
INTRAMUSCULAR | Status: DC | PRN
  Administered 2023-01-08: 30 mL via PERINEURAL

## 2023-01-08 MED ORDER — EPHEDRINE SULFATE (PRESSORS) 50 MG/ML IJ SOLN
INTRAMUSCULAR | Status: DC | PRN
  Administered 2023-01-08 (×4): 10 mg via INTRAVENOUS

## 2023-01-08 MED ORDER — LACTATED RINGERS IV SOLN: INTRAVENOUS | Status: DC

## 2023-01-08 MED ORDER — PROPOFOL 500 MG/50ML IV EMUL
INTRAVENOUS | Status: DC | PRN
  Administered 2023-01-08: 25 ug/kg/min via INTRAVENOUS

## 2023-01-08 MED ORDER — VANCOMYCIN HCL 500 MG IV SOLR
INTRAVENOUS | Status: DC | PRN
  Administered 2023-01-08: 500 mg via TOPICAL

## 2023-01-08 MED ORDER — PROMETHAZINE HCL 25 MG/ML IJ SOLN: 6.2500 mg | INTRAMUSCULAR | Status: DC | PRN

## 2023-01-08 MED ORDER — DEXAMETHASONE SODIUM PHOSPHATE 4 MG/ML IJ SOLN
INTRAMUSCULAR | Status: DC | PRN
  Administered 2023-01-08: 5 mg via PERINEURAL

## 2023-01-08 MED ORDER — VANCOMYCIN HCL 500 MG IV SOLR
INTRAVENOUS | Status: AC
  Filled 2023-01-08: qty 20

## 2023-01-08 MED ORDER — ACETAMINOPHEN 500 MG PO TABS
ORAL_TABLET | ORAL | Status: AC
  Filled 2023-01-08: qty 2

## 2023-01-08 MED ORDER — EPINEPHRINE PF 1 MG/ML IJ SOLN
INTRAMUSCULAR | Status: AC
  Filled 2023-01-08: qty 4

## 2023-01-08 MED ORDER — 0.9 % SODIUM CHLORIDE (POUR BTL) OPTIME
TOPICAL | Status: DC | PRN
  Administered 2023-01-08: 250 mL

## 2023-01-08 MED ORDER — FENTANYL CITRATE (PF) 100 MCG/2ML IJ SOLN
100.0000 ug | Freq: Once | INTRAMUSCULAR | Status: AC
  Administered 2023-01-08: 50 ug via INTRAVENOUS

## 2023-01-08 MED ORDER — CLONIDINE HCL (ANALGESIA) 100 MCG/ML EP SOLN
EPIDURAL | Status: DC | PRN
  Administered 2023-01-08: 80 ug

## 2023-01-08 MED ORDER — CELECOXIB 200 MG PO CAPS
200.0000 mg | ORAL_CAPSULE | Freq: Once | ORAL | Status: AC
  Administered 2023-01-08: 200 mg via ORAL

## 2023-01-08 MED ORDER — MIDAZOLAM HCL 2 MG/2ML IJ SOLN
INTRAMUSCULAR | Status: AC
  Filled 2023-01-08: qty 2

## 2023-01-08 MED ORDER — ACETAMINOPHEN 500 MG PO TABS
1000.0000 mg | ORAL_TABLET | Freq: Once | ORAL | Status: AC
  Administered 2023-01-08: 1000 mg via ORAL

## 2023-01-08 MED ORDER — PROPOFOL 10 MG/ML IV BOLUS
INTRAVENOUS | Status: DC | PRN
  Administered 2023-01-08: 150 mg via INTRAVENOUS

## 2023-01-08 MED ORDER — OXYCODONE HCL 5 MG PO TABS: 5.0000 mg | ORAL_TABLET | Freq: Four times a day (QID) | ORAL | 0 refills | Status: AC | PRN

## 2023-01-08 MED ORDER — CELECOXIB 200 MG PO CAPS
ORAL_CAPSULE | ORAL | Status: AC
  Filled 2023-01-08: qty 1

## 2023-01-08 SURGICAL SUPPLY — 83 items
APL PRP STRL LF DISP 70% ISPRP (MISCELLANEOUS) ×1
BANDAGE ESMARK 6X9 LF (GAUZE/BANDAGES/DRESSINGS) IMPLANT
BIT DRILL 1.5X85 (BIT) IMPLANT
BLADE AVERAGE 25X9 (BLADE) IMPLANT
BLADE LONG MED 25X9 (BLADE) IMPLANT
BLADE MICRO SAGITTAL (BLADE) IMPLANT
BLADE MINI RND TIP GREEN BEAV (BLADE) ×1 IMPLANT
BLADE OSC/SAG .038X5.5 CUT EDG (BLADE) IMPLANT
BLADE SURG 15 STRL LF DISP TIS (BLADE) ×2 IMPLANT
BLADE SURG 15 STRL SS (BLADE) ×2
BNDG CMPR 75X21 PLY HI ABS (MISCELLANEOUS)
BNDG CMPR 9X6 STRL LF SNTH (GAUZE/BANDAGES/DRESSINGS)
BNDG ELASTIC 4X5.8 VLCR STR LF (GAUZE/BANDAGES/DRESSINGS) ×1 IMPLANT
BNDG ELASTIC 6X5.8 VLCR STR LF (GAUZE/BANDAGES/DRESSINGS) IMPLANT
BNDG ESMARK 6X9 LF (GAUZE/BANDAGES/DRESSINGS)
BNDG GZE 12X3 1 PLY HI ABS (GAUZE/BANDAGES/DRESSINGS) ×1
BNDG STRETCH GAUZE 3IN X12FT (GAUZE/BANDAGES/DRESSINGS) ×1 IMPLANT
BUR MIS CONICAL WEDGE 4.3X13 (BUR) IMPLANT
BURR CONICAL 4.3 (BUR)
BURR MIS CONICAL WEDGE 4.3X13 (BUR)
CHLORAPREP W/TINT 26 (MISCELLANEOUS) ×1 IMPLANT
COVER BACK TABLE 60X90IN (DRAPES) ×1 IMPLANT
CUFF TOURN SGL QUICK 24 (TOURNIQUET CUFF)
CUFF TOURN SGL QUICK 34 (TOURNIQUET CUFF)
CUFF TRNQT CYL 24X4X16.5-23 (TOURNIQUET CUFF) IMPLANT
CUFF TRNQT CYL 34X4.125X (TOURNIQUET CUFF) IMPLANT
DRAPE EXTREMITY T 121X128X90 (DISPOSABLE) ×1 IMPLANT
DRAPE OEC MINIVIEW 54X84 (DRAPES) ×1 IMPLANT
DRAPE U-SHAPE 47X51 STRL (DRAPES) ×1 IMPLANT
DRESSING MEPILEX FLEX 4X4 (GAUZE/BANDAGES/DRESSINGS) IMPLANT
DRSG MEPILEX FLEX 4X4 (GAUZE/BANDAGES/DRESSINGS)
DRSG MEPITEL 4X7.2 (GAUZE/BANDAGES/DRESSINGS) ×1 IMPLANT
ELECT REM PT RETURN 9FT ADLT (ELECTROSURGICAL) ×1
ELECTRODE REM PT RTRN 9FT ADLT (ELECTROSURGICAL) ×1 IMPLANT
GAUZE PAD ABD 8X10 STRL (GAUZE/BANDAGES/DRESSINGS) ×1 IMPLANT
GAUZE SPONGE 4X4 12PLY STRL (GAUZE/BANDAGES/DRESSINGS) ×1 IMPLANT
GAUZE STRETCH 2X75IN STRL (MISCELLANEOUS) IMPLANT
GLOVE BIO SURGEON STRL SZ8 (GLOVE) ×1 IMPLANT
GLOVE BIOGEL PI IND STRL 7.0 (GLOVE) IMPLANT
GLOVE BIOGEL PI IND STRL 8 (GLOVE) ×2 IMPLANT
GLOVE ECLIPSE 6.5 STRL STRAW (GLOVE) IMPLANT
GLOVE ECLIPSE 8.0 STRL XLNG CF (GLOVE) ×1 IMPLANT
GLOVE SURG SS PI 7.0 STRL IVOR (GLOVE) IMPLANT
GOWN STRL REUS W/ TWL LRG LVL3 (GOWN DISPOSABLE) ×1 IMPLANT
GOWN STRL REUS W/ TWL XL LVL3 (GOWN DISPOSABLE) ×2 IMPLANT
GOWN STRL REUS W/TWL LRG LVL3 (GOWN DISPOSABLE) ×1
GOWN STRL REUS W/TWL XL LVL3 (GOWN DISPOSABLE) ×3
K-WIRE DBL .054X9 NSTRL (WIRE) ×1
KWIRE DBL .054X9 NSTRL (WIRE) ×1 IMPLANT
NDL HYPO 25X1 1.5 SAFETY (NEEDLE) IMPLANT
NEEDLE HYPO 22GX1.5 SAFETY (NEEDLE) IMPLANT
NEEDLE HYPO 25X1 1.5 SAFETY (NEEDLE) IMPLANT
NS IRRIG 1000ML POUR BTL (IV SOLUTION) ×1 IMPLANT
PACK BASIN DAY SURGERY FS (CUSTOM PROCEDURE TRAY) ×1 IMPLANT
PAD CAST 4YDX4 CTTN HI CHSV (CAST SUPPLIES) ×1 IMPLANT
PADDING CAST ABS COTTON 4X4 ST (CAST SUPPLIES) IMPLANT
PADDING CAST COTTON 4X4 STRL (CAST SUPPLIES) ×1
PADDING CAST COTTON 6X4 STRL (CAST SUPPLIES) IMPLANT
PENCIL SMOKE EVACUATOR (MISCELLANEOUS) ×1 IMPLANT
SANITIZER HAND PURELL FF 515ML (MISCELLANEOUS) ×1 IMPLANT
SCREW CORT ST MINI HEX 2.0X16 (Screw) IMPLANT
SCREW CORT ST MINI HEX 2X22 (Screw) IMPLANT
SCREW CORTICAL ST2.0X24 (Screw) IMPLANT
SHEET MEDIUM DRAPE 40X70 STRL (DRAPES) ×1 IMPLANT
SLEEVE SCD COMPRESS KNEE MED (STOCKING) ×1 IMPLANT
SPLINT PLASTER CAST FAST 5X30 (CAST SUPPLIES) IMPLANT
SPONGE T-LAP 18X18 ~~LOC~~+RFID (SPONGE) ×1 IMPLANT
STOCKINETTE 6  STRL (DRAPES) ×1
STOCKINETTE 6 STRL (DRAPES) ×1 IMPLANT
SUCTION FRAZIER HANDLE 10FR (MISCELLANEOUS) ×1
SUCTION TUBE FRAZIER 10FR DISP (MISCELLANEOUS) ×1 IMPLANT
SUT ETHILON 3 0 PS 1 (SUTURE) ×1 IMPLANT
SUT MNCRL AB 3-0 PS2 18 (SUTURE) ×1 IMPLANT
SUT VIC AB 2-0 SH 27 (SUTURE)
SUT VIC AB 2-0 SH 27XBRD (SUTURE) IMPLANT
SUT VICRYL 0 SH 27 (SUTURE) IMPLANT
SUT VICRYL 0 UR6 27IN ABS (SUTURE) IMPLANT
SYR BULB EAR ULCER 3OZ GRN STR (SYRINGE) ×1 IMPLANT
SYR CONTROL 10ML LL (SYRINGE) IMPLANT
TOWEL GREEN STERILE FF (TOWEL DISPOSABLE) ×2 IMPLANT
TUBE CONNECTING 20X1/4 (TUBING) IMPLANT
UNDERPAD 30X36 HEAVY ABSORB (UNDERPADS AND DIAPERS) ×1 IMPLANT
YANKAUER SUCT BULB TIP NO VENT (SUCTIONS) IMPLANT

## 2023-01-08 NOTE — Transfer of Care (Signed)
Immediate Anesthesia Transfer of Care Note  Patient: Leah Willis  Procedure(s) Performed: Herby Abraham AND Barbie Banner OSTEOTOMY (Right: Toe)  Patient Location: PACU  Anesthesia Type:GA combined with regional for post-op pain  Level of Consciousness: awake, alert , and oriented  Airway & Oxygen Therapy: Patient Spontanous Breathing and Patient connected to nasal cannula oxygen  Post-op Assessment: Report given to RN and Post -op Vital signs reviewed and stable  Post vital signs: Reviewed and stable  Last Vitals:  Vitals Value Taken Time  BP 96/55 01/08/23 0841  Temp    Pulse 64 01/08/23 0843  Resp 14 01/08/23 0843  SpO2 100 % 01/08/23 0843  Vitals shown include unvalidated device data.  Last Pain:  Vitals:   01/08/23 0625  TempSrc: Oral  PainSc: 0-No pain      Patients Stated Pain Goal: 3 (84/41/71 2787)  Complications: No notable events documented.

## 2023-01-08 NOTE — Anesthesia Preprocedure Evaluation (Addendum)
Anesthesia Evaluation  Patient identified by MRN, date of birth, ID band Patient awake    Reviewed: Allergy & Precautions, NPO status , Patient's Chart, lab work & pertinent test results  Airway Mallampati: II  TM Distance: >3 FB Neck ROM: Full    Dental no notable dental hx. (+) Dental Advisory Given, Teeth Intact   Pulmonary neg pulmonary ROS   Pulmonary exam normal breath sounds clear to auscultation       Cardiovascular negative cardio ROS Normal cardiovascular exam Rhythm:Regular Rate:Normal     Neuro/Psych  Headaches    GI/Hepatic negative GI ROS, Neg liver ROS,,,  Endo/Other  negative endocrine ROS    Renal/GU negative Renal ROS     Musculoskeletal negative musculoskeletal ROS (+)    Abdominal   Peds  Hematology  (+) Blood dyscrasia, anemia   Anesthesia Other Findings   Reproductive/Obstetrics                             Anesthesia Physical Anesthesia Plan  ASA: 2  Anesthesia Plan: General   Post-op Pain Management: Tylenol PO (pre-op)*, Celebrex PO (pre-op)* and Regional block*   Induction: Intravenous  PONV Risk Score and Plan: 3 and Ondansetron, Dexamethasone, Treatment may vary due to age or medical condition and Midazolam  Airway Management Planned: LMA  Additional Equipment:   Intra-op Plan:   Post-operative Plan: Extubation in OR  Informed Consent: I have reviewed the patients History and Physical, chart, labs and discussed the procedure including the risks, benefits and alternatives for the proposed anesthesia with the patient or authorized representative who has indicated his/her understanding and acceptance.     Dental advisory given  Plan Discussed with: CRNA  Anesthesia Plan Comments:        Anesthesia Quick Evaluation

## 2023-01-08 NOTE — Progress Notes (Signed)
Assisted Dr. Lissa Hoard with right, popliteal, ultrasound guided block. Side rails up, monitors on throughout procedure. See vital signs in flow sheet. Tolerated Procedure well.

## 2023-01-08 NOTE — H&P (Signed)
Leah Willis is an 52 y.o. female.   Chief Complaint: right foot pain HPI: 52 y/o female without significant PMH c/o R foot pain worsening over many months.  She has a prominent bunion deformity of the right foot.  She has failed non op treatment including activity modification and shoewear modification.  She presents today for surgical treatment.  Past Medical History:  Diagnosis Date   Spherocytosis, hereditary ([place]) 07-Feb-2015    Past Surgical History:  Procedure Laterality Date   FOOT SURGERY Left    bunionectomy    Family History  Problem Relation Age of Onset   Cancer Mother        uterine   Healthy Father    Memory loss Father    Colon cancer Neg Hx    Esophageal cancer Neg Hx    Rectal cancer Neg Hx    Stomach cancer Neg Hx    Social History:  reports that she has never smoked. She has never used smokeless tobacco. She reports that she does not drink alcohol and does not use drugs.  Allergies: No Known Allergies  Medications Prior to Admission  Medication Sig Dispense Refill   b complex vitamins capsule Take 1 capsule by mouth daily.     CALCIUM PO Take by mouth daily.     cholecalciferol (VITAMIN D) 400 units TABS tablet Take 400 Units by mouth.     MAGNESIUM PO Take by mouth.     Multiple Vitamins-Minerals (MULTIVITAMIN WITH MINERALS) tablet Take 1 tablet by mouth daily.     cyclobenzaprine (FLEXERIL) 10 MG tablet Take 1 tablet (10 mg total) by mouth at bedtime. (Patient not taking: Reported on 02/17/2022) 30 tablet 0   folic acid (FOLVITE) 1 MG tablet Take 1 tablet (1 mg total) by mouth daily. 30 tablet 12   zolpidem (AMBIEN) 5 MG tablet Take 1 tablet (5 mg total) by mouth at bedtime as needed for sleep. 15 tablet 0    Results for orders placed or performed during the hospital encounter of February 07, 2023 (from the past 48 hour(s))  Pregnancy, urine POC     Status: None   Collection Time: February 07, 2023  6:22 AM  Result Value Ref Range   Preg Test, Ur NEGATIVE NEGATIVE     Comment:        THE SENSITIVITY OF THIS METHODOLOGY IS >24 mIU/mL    No results found.  Review of Systems  no recent f/c/n/v/wt loss  Blood pressure (!) 96/57, pulse 68, temperature (!) 97.5 F (36.4 C), temperature source Oral, resp. rate 14, height '5\' 6"'$  (1.676 m), weight 62.7 kg, last menstrual period 12/06/2020, SpO2 99 %. Physical Exam  Wn wd woman in nad.  A and O x 4.  Normal mood and affect.  EOMI.  Resp unlabored.  R foot with healthy skin and palpable pulses.  Intact sens to LT.  Moderate bunion deformity.  No lymphadenopathy.  5/5 strength in PF and DF of the toes.  Assessment/Plan Painful right bunion deformity - to the OR today for bunion correction with scarf and Akin osteotomies.  The risks and benefits of the alternative treatment options have been discussed in detail.  The patient wishes to proceed with surgery and specifically understands risks of bleeding, infection, nerve damage, blood clots, need for additional surgery, amputation and death.   Wylene Simmer, MD 02-07-2023, 7:25 AM

## 2023-01-08 NOTE — Anesthesia Procedure Notes (Signed)
Procedure Name: LMA Insertion Date/Time: 01/08/2023 7:41 AM  Performed by: Thaxton Pelley, Ernesta Amble, CRNAPre-anesthesia Checklist: Patient identified, Emergency Drugs available, Suction available and Patient being monitored Patient Re-evaluated:Patient Re-evaluated prior to induction Oxygen Delivery Method: Circle system utilized Preoxygenation: Pre-oxygenation with 100% oxygen Induction Type: IV induction Ventilation: Mask ventilation without difficulty LMA: LMA inserted LMA Size: 4.0 Number of attempts: 1 Airway Equipment and Method: Bite block Placement Confirmation: positive ETCO2 Tube secured with: Tape Dental Injury: Teeth and Oropharynx as per pre-operative assessment

## 2023-01-08 NOTE — Discharge Instructions (Addendum)
Leah Simmer, MD [facility]  Please read the following information regarding your care after surgery.  Medications  You only need a prescription for the narcotic pain medicine (ex. oxycodone, Percocet, Norco).  All of the other medicines listed below are available over the counter. ? Aleve 2 pills twice a day for the first 3 days after surgery. ? acetominophen (Tylenol) 650 mg every 4-6 hours as you need for minor to moderate pain ? oxycodone as prescribed for severe pain  Narcotic pain medicine (ex. oxycodone, Percocet, Vicodin) will cause constipation.  To prevent this problem, take the following medicines while you are taking any pain medicine. ? docusate sodium (Colace) 100 mg twice a day ? senna (Senokot) 2 tablets twice a day  Weight Bearing ? Bear weight only on your operated foot in the post-op shoe.   Cast / Splint / Dressing ? Keep your splint, cast or dressing clean and dry.  Don't put anything (coat hanger, pencil, etc) down inside of it.  If it gets damp, use a hair dryer on the cool setting to dry it.  If it gets soaked, call the office to schedule an appointment for a cast change.  After your dressing, cast or splint is removed; you may shower, but do not soak or scrub the wound.  Allow the water to run over it, and then gently pat it dry.  Swelling It is normal for you to have swelling where you had surgery.  To reduce swelling and pain, keep your toes above your nose for at least 3 days after surgery.  It may be necessary to keep your foot or leg elevated for several weeks.  If it hurts, it should be elevated.  Follow Up Call my office at [phone] when you are discharged from the hospital or surgery center to schedule an appointment to be seen two weeks after surgery.  Call my office at [phone] if you develop a fever >101.5 F, nausea, vomiting, bleeding from the surgical site or severe pain.    No Tylenol until after 1:10pm today if needed   Post  Anesthesia Home Care Instructions  Activity: Get plenty of rest for the remainder of the day. A responsible individual must stay with you for 24 hours following the procedure.  For the next 24 hours, DO NOT: -Drive a car -Paediatric nurse -Drink alcoholic beverages -Take any medication unless instructed by your physician -Make any legal decisions or sign important papers.  Meals: Start with liquid foods such as gelatin or soup. Progress to regular foods as tolerated. Avoid greasy, spicy, heavy foods. If nausea and/or vomiting occur, drink only clear liquids until the nausea and/or vomiting subsides. Call your physician if vomiting continues.  Special Instructions/Symptoms: Your throat may feel dry or sore from the anesthesia or the breathing tube placed in your throat during surgery. If this causes discomfort, gargle with warm salt water. The discomfort should disappear within 24 hours.  If you had a scopolamine patch placed behind your ear for the management of post- operative nausea and/or vomiting:  1. The medication in the patch is effective for 72 hours, after which it should be removed.  Wrap patch in a tissue and discard in the trash. Wash hands thoroughly with soap and water. 2. You may remove the patch earlier than 72 hours if you experience unpleasant side effects which may include dry mouth, dizziness or visual disturbances. 3. Avoid touching the patch. Wash your hands with soap and water after contact with the patch.  Regional Anesthesia Blocks  1. Numbness or the inability to move the "blocked" extremity may last from 3-48 hours after placement. The length of time depends on the medication injected and your individual response to the medication. If the numbness is not going away after 48 hours, call your surgeon.  2. The extremity that is blocked will need to be protected until the numbness is gone and the  Strength has returned. Because you cannot feel it, you will need to  take extra care to avoid injury. Because it may be weak, you may have difficulty moving it or using it. You may not know what position it is in without looking at it while the block is in effect.  3. For blocks in the legs and feet, returning to weight bearing and walking needs to be done carefully. You will need to wait until the numbness is entirely gone and the strength has returned. You should be able to move your leg and foot normally before you try and bear weight or walk. You will need someone to be with you when you first try to ensure you do not fall and possibly risk injury.  4. Bruising and tenderness at the needle site are common side effects and will resolve in a few days.  5. Persistent numbness or new problems with movement should be communicated to the surgeon or the [place] [phone] [place] [phone]).

## 2023-01-08 NOTE — Anesthesia Procedure Notes (Signed)
Anesthesia Regional Block: Popliteal block   Pre-Anesthetic Checklist: , timeout performed,  Correct Patient, Correct Site, Correct Laterality,  Correct Procedure, Correct Position, site marked,  Risks and benefits discussed,  Surgical consent,  Pre-op evaluation,  At surgeon's request and post-op pain management  Laterality: Lower and Right  Prep: chloraprep       Needles:  Injection technique: Single-shot  Needle Type: Stimiplex     Needle Length: 10cm  Needle Gauge: 21     Additional Needles:   Procedures:,,,, ultrasound used (permanent image in chart),,   Motor weakness within 5 minutes.  Narrative:  Start time: 01/08/2023 6:49 AM End time: 01/08/2023 7:09 AM Injection made incrementally with aspirations every 5 mL.  Performed by: Personally  Anesthesiologist: Nolon Nations, MD  Additional Notes: Nerve located and needle positioned with direct ultrasound guidance. Good perineural spread. Patient tolerated well.

## 2023-01-08 NOTE — Op Note (Signed)
01/08/2023  8:51 AM  PATIENT:  Leah Willis  52 y.o. female  PRE-OPERATIVE DIAGNOSIS: Painful right foot bunion deformity  POST-OPERATIVE DIAGNOSIS: Same  Procedure(s): 1.  Correction of right foot bunion deformity with double osteotomy (scarf and Akin) 2.  AP and lateral radiographs of the right foot  SURGEON:  Wylene Simmer, MD  ASSISTANT: Mechele Claude, PA-C  ANESTHESIA:   General, regional  EBL:  minimal   TOURNIQUET:   Total Tourniquet Time Documented: Thigh (Right) - 38 minutes Total: Thigh (Right) - 38 minutes  COMPLICATIONS:  None apparent  DISPOSITION:  Extubated, awake and stable to recovery.  INDICATION FOR PROCEDURE: 52 year old female without significant past medical history complains of worsening pain due to a prominent right foot bunion deformity.  She has failed nonoperative treatment to date and presents today for surgical correction of this painful right forefoot deformity.  The risks and benefits of the alternative treatment options have been discussed in detail.  The patient wishes to proceed with surgery and specifically understands risks of bleeding, infection, nerve damage, blood clots, need for additional surgery, amputation and death.    PROCEDURE IN DETAIL:After pre operative consent was obtained, and the correct operative site was identified, the patient was brought to the operating room and placed supine on the OR table.  Anesthesia was administered.  Pre-operative antibiotics were administered.  A surgical timeout was taken.  The right lower extremity was prepped and draped in standard sterile fashion with a tourniquet around the thigh.  The extremity was elevated, and the tourniquet was inflated to 250 mmHg.  An incision was made at the dorsum of the first webspace.  Dissection was carried sharply down through the subcutaneous tissues.  The intermetatarsal ligament was divided.  An arthrotomy was then made between the lateral sesamoid and the first  metatarsal head.  Small perforations were made in the lateral joint capsule.  The hallux could then be positioned in 20 degrees of varus passively.  Attention was turned to the medial forefoot where a longitudinal incision was made along the first ray.  Dissection was carried sharply down through the subcutaneous tissues.  The medial joint capsule was incised and elevated dorsally and plantarly.  The hypertrophic medial eminence was resected in line with the first metatarsal shaft.  A scarf osteotomy was then made in the first metatarsal.  The head of the metatarsal was translated laterally correcting the intermetatarsal and hallux valgus angles.  The osteotomy was fixed with 2 [employer] stainless steel 2 mm screws.  Overhanging bone medially was trimmed with the oscillating saw.  Dissection was then carried distally along the base of the hallux.  The flexor and extensor tendons were protected.  A closing wedge osteotomy was made medially with the oscillating saw.  The osteotomy was closed and fixed with a 2 mm screw.  AP and lateral radiographs confirmed appropriate correction of the bunion deformity and appropriate position and length of all hardware.  The wounds were irrigated copiously and sprinkled with vancomycin powder.  The medial joint capsule was repaired with imbricating sutures of 2-0 Vicryl.  The skin incisions were closed with 3-0 nylon.  Sterile dressings were applied followed by a bunion wrap.  The tourniquet was released after application of the dressings.  The patient was awakened from anesthesia and transported to the recovery room in stable condition.   FOLLOW UP PLAN: Weightbearing as tolerated in a Darco shoe.  Follow-up in the office in 2 weeks for suture removal and  conversion to a toe spacer in a flat postop shoe.  No indication for DVT prophylaxis in this ambulatory patient.   RADIOGRAPHS: AP and lateral radiographs of the right foot are obtained intraoperatively.  These show  interval correction of the bunion deformity.  Hardware is appropriately positioned and of the appropriate lengths.  No other acute injuries are noted.    Mechele Claude PA-C was present and scrubbed for the duration of the operative case. His assistance was essential in positioning the patient, prepping and draping, gaining and maintaining exposure, performing the operation, closing and dressing the wounds and applying the splint.

## 2023-01-08 NOTE — Anesthesia Postprocedure Evaluation (Signed)
Anesthesia Post Note  Patient: Leah Willis  Procedure(s) Performed: Herby Abraham AND Barbie Banner OSTEOTOMY (Right: Foot)     Patient location during evaluation: PACU Anesthesia Type: General Level of consciousness: sedated and patient cooperative Pain management: pain level controlled Vital Signs Assessment: post-procedure vital signs reviewed and stable Respiratory status: spontaneous breathing Cardiovascular status: stable Anesthetic complications: no   No notable events documented.  Last Vitals:  Vitals:   01/08/23 0915 01/08/23 0942  BP: (!) 99/58 93/64  Pulse: 82 73  Resp: 17 18  Temp:  (!) 36.3 C  SpO2: 95% 98%    Last Pain:  Vitals:   01/08/23 0942  TempSrc: Oral  PainSc: 0-No pain                 Nolon Nations

## 2023-01-09 ENCOUNTER — Encounter (HOSPITAL_BASED_OUTPATIENT_CLINIC_OR_DEPARTMENT_OTHER): Payer: Self-pay | Admitting: Orthopedic Surgery

## 2023-02-19 ENCOUNTER — Encounter: Payer: Commercial Managed Care - PPO | Admitting: Family Medicine

## 2023-02-19 ENCOUNTER — Ambulatory Visit (INDEPENDENT_AMBULATORY_CARE_PROVIDER_SITE_OTHER): Payer: Commercial Managed Care - PPO | Admitting: Family Medicine

## 2023-02-19 ENCOUNTER — Encounter: Payer: Self-pay | Admitting: Family Medicine

## 2023-02-19 VITALS — BP 102/60 | HR 76 | Temp 97.8°F | Resp 16 | Ht 66.0 in | Wt 135.6 lb

## 2023-02-19 DIAGNOSIS — D649 Anemia, unspecified: Secondary | ICD-10-CM

## 2023-02-19 DIAGNOSIS — Z23 Encounter for immunization: Secondary | ICD-10-CM

## 2023-02-19 DIAGNOSIS — E559 Vitamin D deficiency, unspecified: Secondary | ICD-10-CM

## 2023-02-19 DIAGNOSIS — Z0000 Encounter for general adult medical examination without abnormal findings: Secondary | ICD-10-CM

## 2023-02-19 LAB — CBC WITH DIFFERENTIAL/PLATELET
Basophils Absolute: 0 10*3/uL (ref 0.0–0.1)
Basophils Relative: 0.6 % (ref 0.0–3.0)
Eosinophils Absolute: 0.1 10*3/uL (ref 0.0–0.7)
Eosinophils Relative: 1.2 % (ref 0.0–5.0)
HCT: 30.1 % — ABNORMAL LOW (ref 36.0–46.0)
Hemoglobin: 10.8 g/dL — ABNORMAL LOW (ref 12.0–15.0)
Lymphocytes Relative: 24.6 % (ref 12.0–46.0)
Lymphs Abs: 1.4 10*3/uL (ref 0.7–4.0)
MCHC: 35.9 g/dL (ref 30.0–36.0)
MCV: 92.8 fl (ref 78.0–100.0)
Monocytes Absolute: 0.3 10*3/uL (ref 0.1–1.0)
Monocytes Relative: 5.3 % (ref 3.0–12.0)
Neutro Abs: 3.8 10*3/uL (ref 1.4–7.7)
Neutrophils Relative %: 68.3 % (ref 43.0–77.0)
Platelets: 137 10*3/uL — ABNORMAL LOW (ref 150.0–400.0)
RBC: 3.24 Mil/uL — ABNORMAL LOW (ref 3.87–5.11)
RDW: 18.5 % — ABNORMAL HIGH (ref 11.5–15.5)
WBC: 5.6 10*3/uL (ref 4.0–10.5)

## 2023-02-19 LAB — HEPATIC FUNCTION PANEL
ALT: 13 U/L (ref 0–35)
AST: 20 U/L (ref 0–37)
Albumin: 4.1 g/dL (ref 3.5–5.2)
Alkaline Phosphatase: 37 U/L — ABNORMAL LOW (ref 39–117)
Bilirubin, Direct: 0.3 mg/dL (ref 0.0–0.3)
Total Bilirubin: 1.3 mg/dL — ABNORMAL HIGH (ref 0.2–1.2)
Total Protein: 6.7 g/dL (ref 6.0–8.3)

## 2023-02-19 LAB — LIPID PANEL
Cholesterol: 114 mg/dL (ref 0–200)
HDL: 49.4 mg/dL (ref >39.00)
LDL Cholesterol: 55 mg/dL (ref 0–99)
NonHDL: 64.77
Total CHOL/HDL Ratio: 2
Triglycerides: 48 mg/dL (ref 0.0–149.0)
VLDL: 9.6 mg/dL (ref 0.0–40.0)

## 2023-02-19 LAB — BASIC METABOLIC PANEL
BUN: 13 mg/dL (ref 6–23)
CO2: 25 mEq/L (ref 19–32)
Calcium: 9.1 mg/dL (ref 8.4–10.5)
Chloride: 109 mEq/L (ref 96–112)
Creatinine, Ser: 0.63 mg/dL (ref 0.40–1.20)
GFR: 101.89 mL/min (ref >60.00)
Glucose, Bld: 96 mg/dL (ref 70–99)
Potassium: 3.9 mEq/L (ref 3.5–5.1)
Sodium: 140 mEq/L (ref 135–145)

## 2023-02-19 LAB — VITAMIN D 25 HYDROXY (VIT D DEFICIENCY, FRACTURES): VITD: 43.6 ng/mL (ref 30.00–100.00)

## 2023-02-19 LAB — TSH: TSH: 1.53 u[IU]/mL (ref 0.35–5.50)

## 2023-02-19 MED ORDER — ZOLPIDEM TARTRATE 5 MG PO TABS: 5.0000 mg | ORAL_TABLET | Freq: Every evening | ORAL | 0 refills | Status: AC | PRN

## 2023-02-19 NOTE — Patient Instructions (Signed)
Schedule a nurse visit in 6 months to get the 2nd shingles shot Follow up with me in 1 year We'll notify you of your lab results and make any changes if needed Keep up the good work on healthy diet and regular exercise- you look great! Call with any questions or concerns Stay Safe!  Stay Healthy!! Enjoy the Cruise!!!

## 2023-02-19 NOTE — Assessment & Plan Note (Signed)
Check labs and replete prn. 

## 2023-02-19 NOTE — Assessment & Plan Note (Signed)
Pt's PE WNL.  UTD on pap, mammo, Tdap, colonoscopy.  Check labs.  Anticipatory guidance provided.

## 2023-02-19 NOTE — Progress Notes (Signed)
   Subjective:    Patient ID: Leah Willis, female    DOB: 08/31/1971, 52 y.o.   MRN: [id number]  HPI CPE- UTD on pap, mammo, Tdap, colonoscopy.  Patient Care Team    Relationship Specialty Notifications Start End  Midge Minium, MD PCP - General Family Medicine  11/25/16   Louretta Shorten, MD Consulting Physician Obstetrics and Gynecology  08/05/16   Warren Danes, PA-C Physician Assistant Dermatology  02/06/22     Health Maintenance  Topic Date Due   Zoster Vaccines- Shingrix (1 of 2) Never done   PAP SMEAR-Modifier  03/06/2022   MAMMOGRAM  10/02/2022   DTaP/Tdap/Td (2 - Td or Tdap) 09/26/2024   COLONOSCOPY (Pts 45-57yr Insurance coverage will need to be confirmed)  10/11/2030   Hepatitis C Screening  Completed   HIV Screening  Completed   HPV VACCINES  Aged Out   INFLUENZA VACCINE  Discontinued   COVID-19 Vaccine  Discontinued      Review of Systems Patient reports no vision/ hearing changes, adenopathy,fever, weight change,  persistant/recurrent hoarseness , swallowing issues, chest pain, palpitations, edema, persistant/recurrent cough, hemoptysis, dyspnea (rest/exertional/paroxysmal nocturnal), gastrointestinal bleeding (melena, rectal bleeding), abdominal pain, significant heartburn, bowel changes, GU symptoms (dysuria, hematuria, incontinence), Gyn symptoms (abnormal  bleeding, pain),  syncope, focal weakness, memory loss, numbness & tingling, skin/hair/nail changes, abnormal bruising or bleeding, anxiety, or depression.     Objective:   Physical Exam General Appearance:    Alert, cooperative, no distress, appears stated age  Head:    Normocephalic, without obvious abnormality, atraumatic  Eyes:    PERRL, conjunctiva/corneas clear, EOM's intact both eyes  Ears:    Normal TM's and external ear canals, both ears  Nose:   Nares normal, septum midline, mucosa normal, no drainage    or sinus tenderness  Throat:   Lips, mucosa, and tongue normal; teeth and gums normal   Neck:   Supple, symmetrical, trachea midline, no adenopathy;    Thyroid: no enlargement/tenderness/nodules  Back:     Symmetric, no curvature, ROM normal, no CVA tenderness  Lungs:     Clear to auscultation bilaterally, respirations unlabored  Chest Wall:    No tenderness or deformity   Heart:    Regular rate and rhythm, S1 and S2 normal, no murmur, rub   or gallop  Breast Exam:    Deferred to GYN  Abdomen:     Soft, non-tender, bowel sounds active all four quadrants,    no masses, no organomegaly  Genitalia:    Deferred to GYN  Rectal:    Extremities:   Extremities normal, atraumatic, no cyanosis or edema  Pulses:   2+ and symmetric all extremities  Skin:   Skin color, texture, turgor normal, no rashes or lesions  Lymph nodes:   Cervical, supraclavicular, and axillary nodes normal  Neurologic:   CNII-XII intact, normal strength, sensation and reflexes    throughout          Assessment & Plan:

## 2023-02-20 ENCOUNTER — Telehealth: Payer: Self-pay

## 2023-02-20 NOTE — Telephone Encounter (Signed)
Pt seen results Via my chart  

## 2023-02-20 NOTE — Telephone Encounter (Signed)
-----   Message from Midge Minium, MD sent at 02/20/2023  7:35 AM EST ----- Labs are stable and look good!  No changes at this time

## 2023-02-23 DIAGNOSIS — M2011 Hallux valgus (acquired), right foot: Secondary | ICD-10-CM | POA: Diagnosis not present

## 2023-02-23 DIAGNOSIS — Z4889 Encounter for other specified surgical aftercare: Secondary | ICD-10-CM | POA: Diagnosis not present

## 2023-03-23 DIAGNOSIS — M2011 Hallux valgus (acquired), right foot: Secondary | ICD-10-CM | POA: Diagnosis not present

## 2023-04-27 DIAGNOSIS — M2011 Hallux valgus (acquired), right foot: Secondary | ICD-10-CM | POA: Diagnosis not present

## 2023-07-30 ENCOUNTER — Encounter (INDEPENDENT_AMBULATORY_CARE_PROVIDER_SITE_OTHER): Payer: Self-pay

## 2023-08-27 ENCOUNTER — Ambulatory Visit (INDEPENDENT_AMBULATORY_CARE_PROVIDER_SITE_OTHER): Payer: Commercial Managed Care - PPO

## 2023-08-27 DIAGNOSIS — Z23 Encounter for immunization: Secondary | ICD-10-CM

## 2023-08-27 NOTE — Progress Notes (Signed)
Leah Willis is a 52 y.o. female presents to the office today for Shingles injections, per physician's orders. Original order: 02/19/2023 Shingrix 0.5 mL injected intramuscular to the right deltoid today. Patient tolerated injection. Patient due for follow up labs/provider appt: No. Shingles vaccine series is now complete   [employer]

## 2023-09-28 ENCOUNTER — Other Ambulatory Visit (HOSPITAL_COMMUNITY): Payer: Self-pay | Admitting: Orthopedic Surgery

## 2023-09-28 ENCOUNTER — Encounter (HOSPITAL_BASED_OUTPATIENT_CLINIC_OR_DEPARTMENT_OTHER): Payer: Self-pay | Admitting: Orthopedic Surgery

## 2023-09-28 DIAGNOSIS — M2011 Hallux valgus (acquired), right foot: Secondary | ICD-10-CM | POA: Diagnosis not present

## 2023-09-28 DIAGNOSIS — T8484XA Pain due to internal orthopedic prosthetic devices, implants and grafts, initial encounter: Secondary | ICD-10-CM | POA: Diagnosis not present

## 2023-10-01 ENCOUNTER — Ambulatory Visit (HOSPITAL_BASED_OUTPATIENT_CLINIC_OR_DEPARTMENT_OTHER): Payer: Commercial Managed Care - PPO | Admitting: Certified Registered Nurse Anesthetist

## 2023-10-01 ENCOUNTER — Ambulatory Visit (HOSPITAL_BASED_OUTPATIENT_CLINIC_OR_DEPARTMENT_OTHER)
Admission: RE | Admit: 2023-10-01 | Discharge: 2023-10-01 | Disposition: A | Discharge status: Home / Self Care | Payer: Commercial Managed Care - PPO | Attending: Orthopedic Surgery | Admitting: Orthopedic Surgery

## 2023-10-01 ENCOUNTER — Encounter (HOSPITAL_BASED_OUTPATIENT_CLINIC_OR_DEPARTMENT_OTHER): Payer: Self-pay | Admitting: Orthopedic Surgery

## 2023-10-01 ENCOUNTER — Encounter (HOSPITAL_BASED_OUTPATIENT_CLINIC_OR_DEPARTMENT_OTHER)
Admission: RE | Disposition: A | Discharge status: Home / Self Care | Payer: Self-pay | Source: Home / Self Care | Attending: Orthopedic Surgery

## 2023-10-01 ENCOUNTER — Other Ambulatory Visit: Payer: Self-pay

## 2023-10-01 ENCOUNTER — Ambulatory Visit (HOSPITAL_BASED_OUTPATIENT_CLINIC_OR_DEPARTMENT_OTHER): Payer: Commercial Managed Care - PPO

## 2023-10-01 DIAGNOSIS — T8484XA Pain due to internal orthopedic prosthetic devices, implants and grafts, initial encounter: Secondary | ICD-10-CM

## 2023-10-01 DIAGNOSIS — X58XXXA Exposure to other specified factors, initial encounter: Secondary | ICD-10-CM | POA: Insufficient documentation

## 2023-10-01 DIAGNOSIS — Y831 Surgical operation with implant of artificial internal device as the cause of abnormal reaction of the patient, or of later complication, without mention of misadventure at the time of the procedure: Secondary | ICD-10-CM | POA: Diagnosis not present

## 2023-10-01 DIAGNOSIS — Z419 Encounter for procedure for purposes other than remedying health state, unspecified: Secondary | ICD-10-CM

## 2023-10-01 DIAGNOSIS — L600 Ingrowing nail: Secondary | ICD-10-CM | POA: Diagnosis not present

## 2023-10-01 DIAGNOSIS — Z969 Presence of functional implant, unspecified: Secondary | ICD-10-CM

## 2023-10-01 HISTORY — PX: HARDWARE REMOVAL: SHX979

## 2023-10-01 LAB — POCT PREGNANCY, URINE: Preg Test, Ur: NEGATIVE

## 2023-10-01 SURGERY — REMOVAL, HARDWARE
Anesthesia: Monitor Anesthesia Care | Site: Foot | Laterality: Right

## 2023-10-01 MED ORDER — MIDAZOLAM HCL 2 MG/2ML IJ SOLN
INTRAMUSCULAR | Status: AC
  Filled 2023-10-01: qty 2

## 2023-10-01 MED ORDER — BUPIVACAINE-EPINEPHRINE (PF) 0.5% -1:200000 IJ SOLN
INTRAMUSCULAR | Status: DC | PRN
Start: 2023-10-01 — End: 2023-10-01
  Administered 2023-10-01: 10 mL

## 2023-10-01 MED ORDER — ACETAMINOPHEN 10 MG/ML IV SOLN: 1000.0000 mg | Freq: Once | INTRAVENOUS | Status: DC | PRN

## 2023-10-01 MED ORDER — EPHEDRINE SULFATE (PRESSORS) 50 MG/ML IJ SOLN
INTRAMUSCULAR | Status: DC | PRN
  Administered 2023-10-01: 10 mg via INTRAVENOUS

## 2023-10-01 MED ORDER — 0.9 % SODIUM CHLORIDE (POUR BTL) OPTIME
TOPICAL | Status: DC | PRN
  Administered 2023-10-01: 120 mL

## 2023-10-01 MED ORDER — ONDANSETRON HCL 4 MG/2ML IJ SOLN
INTRAMUSCULAR | Status: DC | PRN
  Administered 2023-10-01: 4 mg via INTRAVENOUS

## 2023-10-01 MED ORDER — CEFAZOLIN SODIUM-DEXTROSE 2-4 GM/100ML-% IV SOLN
INTRAVENOUS | Status: AC
  Filled 2023-10-01: qty 100

## 2023-10-01 MED ORDER — CEFAZOLIN SODIUM-DEXTROSE 2-4 GM/100ML-% IV SOLN
2.0000 g | INTRAVENOUS | Status: AC
  Administered 2023-10-01: 2 g via INTRAVENOUS

## 2023-10-01 MED ORDER — DEXAMETHASONE SODIUM PHOSPHATE 10 MG/ML IJ SOLN
INTRAMUSCULAR | Status: AC
  Filled 2023-10-01: qty 1

## 2023-10-01 MED ORDER — MIDAZOLAM HCL 2 MG/2ML IJ SOLN: 2.0000 mg | Freq: Once | INTRAMUSCULAR | Status: DC

## 2023-10-01 MED ORDER — FENTANYL CITRATE (PF) 100 MCG/2ML IJ SOLN
INTRAMUSCULAR | Status: DC | PRN
  Administered 2023-10-01: 50 ug via INTRAVENOUS

## 2023-10-01 MED ORDER — EPHEDRINE 5 MG/ML INJ
INTRAVENOUS | Status: AC
  Filled 2023-10-01: qty 5

## 2023-10-01 MED ORDER — KETOROLAC TROMETHAMINE 30 MG/ML IJ SOLN
INTRAMUSCULAR | Status: DC | PRN
  Administered 2023-10-01: 30 mg via INTRAVENOUS

## 2023-10-01 MED ORDER — FENTANYL CITRATE (PF) 100 MCG/2ML IJ SOLN: 25.0000 ug | INTRAMUSCULAR | Status: DC | PRN

## 2023-10-01 MED ORDER — PROPOFOL 500 MG/50ML IV EMUL
INTRAVENOUS | Status: DC | PRN
  Administered 2023-10-01: 100 ug/kg/min via INTRAVENOUS

## 2023-10-01 MED ORDER — OXYCODONE HCL 5 MG PO TABS: 5.0000 mg | ORAL_TABLET | Freq: Once | ORAL | Status: DC | PRN

## 2023-10-01 MED ORDER — ONDANSETRON HCL 4 MG/2ML IJ SOLN: 4.0000 mg | Freq: Once | INTRAMUSCULAR | Status: DC | PRN

## 2023-10-01 MED ORDER — OXYCODONE HCL 5 MG/5ML PO SOLN: 5.0000 mg | Freq: Once | ORAL | Status: DC | PRN

## 2023-10-01 MED ORDER — KETOROLAC TROMETHAMINE 30 MG/ML IJ SOLN
INTRAMUSCULAR | Status: AC
  Filled 2023-10-01: qty 1

## 2023-10-01 MED ORDER — LACTATED RINGERS IV SOLN: INTRAVENOUS | Status: DC

## 2023-10-01 MED ORDER — MIDAZOLAM HCL 5 MG/5ML IJ SOLN
INTRAMUSCULAR | Status: DC | PRN
  Administered 2023-10-01: 2 mg via INTRAVENOUS

## 2023-10-01 MED ORDER — PROPOFOL 500 MG/50ML IV EMUL
INTRAVENOUS | Status: AC
  Filled 2023-10-01: qty 50

## 2023-10-01 MED ORDER — PROPOFOL 10 MG/ML IV BOLUS
INTRAVENOUS | Status: DC | PRN
  Administered 2023-10-01: 20 mg via INTRAVENOUS

## 2023-10-01 MED ORDER — ONDANSETRON HCL 4 MG/2ML IJ SOLN
INTRAMUSCULAR | Status: AC
  Filled 2023-10-01: qty 2

## 2023-10-01 MED ORDER — FENTANYL CITRATE (PF) 100 MCG/2ML IJ SOLN
INTRAMUSCULAR | Status: AC
  Filled 2023-10-01: qty 2

## 2023-10-01 MED ORDER — FENTANYL CITRATE (PF) 100 MCG/2ML IJ SOLN: 100.0000 ug | Freq: Once | INTRAMUSCULAR | Status: DC

## 2023-10-01 MED ORDER — VANCOMYCIN HCL 500 MG IV SOLR
INTRAVENOUS | Status: AC
  Filled 2023-10-01: qty 10

## 2023-10-01 MED ORDER — BUPIVACAINE-EPINEPHRINE (PF) 0.5% -1:200000 IJ SOLN
INTRAMUSCULAR | Status: AC
  Filled 2023-10-01: qty 30

## 2023-10-01 MED ORDER — DEXAMETHASONE SODIUM PHOSPHATE 4 MG/ML IJ SOLN
INTRAMUSCULAR | Status: DC | PRN
  Administered 2023-10-01: 5 mg via INTRAVENOUS

## 2023-10-01 MED ORDER — LIDOCAINE 2% (20 MG/ML) 5 ML SYRINGE
INTRAMUSCULAR | Status: AC
  Filled 2023-10-01: qty 5

## 2023-10-01 SURGICAL SUPPLY — 63 items
APL PRP STRL LF DISP 70% ISPRP (MISCELLANEOUS) ×1
APL SKNCLS STERI-STRIP NONHPOA (GAUZE/BANDAGES/DRESSINGS) ×1
BANDAGE ESMARK 6X9 LF (GAUZE/BANDAGES/DRESSINGS) IMPLANT
BENZOIN TINCTURE PRP APPL 2/3 (GAUZE/BANDAGES/DRESSINGS) IMPLANT
BLADE SURG 15 STRL LF DISP TIS (BLADE) ×2 IMPLANT
BLADE SURG 15 STRL SS (BLADE) ×2
BNDG CMPR 5X4 KNIT ELC UNQ LF (GAUZE/BANDAGES/DRESSINGS) ×1
BNDG CMPR 6 X 5 YARDS HK CLSR (GAUZE/BANDAGES/DRESSINGS)
BNDG CMPR 9X4 STRL LF SNTH (GAUZE/BANDAGES/DRESSINGS) ×1
BNDG CMPR 9X6 STRL LF SNTH (GAUZE/BANDAGES/DRESSINGS)
BNDG ELASTIC 4INX 5YD STR LF (GAUZE/BANDAGES/DRESSINGS) IMPLANT
BNDG ELASTIC 6INX 5YD STR LF (GAUZE/BANDAGES/DRESSINGS) IMPLANT
BNDG ESMARK 4X9 LF (GAUZE/BANDAGES/DRESSINGS) IMPLANT
BNDG ESMARK 6X9 LF (GAUZE/BANDAGES/DRESSINGS)
CHLORAPREP W/TINT 26 (MISCELLANEOUS) ×1 IMPLANT
COVER BACK TABLE 60X90IN (DRAPES) ×1 IMPLANT
CUFF TOURN SGL QUICK 34 (TOURNIQUET CUFF)
CUFF TRNQT CYL 34X4.125X (TOURNIQUET CUFF) IMPLANT
DRAPE EXTREMITY T 121X128X90 (DISPOSABLE) ×1 IMPLANT
DRAPE OEC MINIVIEW 54X84 (DRAPES) IMPLANT
DRAPE SURG 17X23 STRL (DRAPES) IMPLANT
DRAPE U-SHAPE 47X51 STRL (DRAPES) ×1 IMPLANT
DRSG MEPITEL 4X7.2 (GAUZE/BANDAGES/DRESSINGS) ×1 IMPLANT
ELECT REM PT RETURN 9FT ADLT (ELECTROSURGICAL) ×1
ELECTRODE REM PT RTRN 9FT ADLT (ELECTROSURGICAL) ×1 IMPLANT
GAUZE PAD ABD 8X10 STRL (GAUZE/BANDAGES/DRESSINGS) IMPLANT
GAUZE SPONGE 4X4 12PLY STRL (GAUZE/BANDAGES/DRESSINGS) ×1 IMPLANT
GLOVE BIO SURGEON STRL SZ8 (GLOVE) ×1 IMPLANT
GLOVE BIOGEL PI IND STRL 7.0 (GLOVE) IMPLANT
GLOVE BIOGEL PI IND STRL 8 (GLOVE) ×2 IMPLANT
GLOVE ECLIPSE 8.0 STRL XLNG CF (GLOVE) ×1 IMPLANT
GLOVE SURG SS PI 7.0 STRL IVOR (GLOVE) IMPLANT
GOWN STRL REUS W/ TWL LRG LVL3 (GOWN DISPOSABLE) ×1 IMPLANT
GOWN STRL REUS W/ TWL XL LVL3 (GOWN DISPOSABLE) ×2 IMPLANT
GOWN STRL REUS W/TWL LRG LVL3 (GOWN DISPOSABLE) ×3
GOWN STRL REUS W/TWL XL LVL3 (GOWN DISPOSABLE) ×2
NDL HYPO 22X1.5 SAFETY MO (MISCELLANEOUS) IMPLANT
NEEDLE HYPO 22X1.5 SAFETY MO (MISCELLANEOUS) IMPLANT
PACK BASIN DAY SURGERY FS (CUSTOM PROCEDURE TRAY) ×1 IMPLANT
PAD CAST 4YDX4 CTTN HI CHSV (CAST SUPPLIES) ×1 IMPLANT
PADDING CAST COTTON 4X4 STRL (CAST SUPPLIES) ×1
PADDING CAST COTTON 6X4 STRL (CAST SUPPLIES) IMPLANT
PENCIL SMOKE EVACUATOR (MISCELLANEOUS) ×1 IMPLANT
SANITIZER HAND PURELL FF 515ML (MISCELLANEOUS) ×1 IMPLANT
SHEET MEDIUM DRAPE 40X70 STRL (DRAPES) ×1 IMPLANT
SLEEVE SCD COMPRESS KNEE MED (STOCKING) ×1 IMPLANT
SPIKE FLUID TRANSFER (MISCELLANEOUS) IMPLANT
SPLINT PLASTER CAST FAST 5X30 (CAST SUPPLIES) IMPLANT
SPONGE T-LAP 18X18 ~~LOC~~+RFID (SPONGE) ×1 IMPLANT
STOCKINETTE 6 STRL (DRAPES) ×1 IMPLANT
STRIP CLOSURE SKIN 1/2X4 (GAUZE/BANDAGES/DRESSINGS) IMPLANT
SUCTION TUBE FRAZIER 10FR DISP (SUCTIONS) IMPLANT
SUT ETHILON 3 0 PS 1 (SUTURE) IMPLANT
SUT MNCRL AB 3-0 PS2 18 (SUTURE) ×1 IMPLANT
SUT VIC AB 2-0 SH 18 (SUTURE) IMPLANT
SUT VIC AB 2-0 SH 27 (SUTURE)
SUT VIC AB 2-0 SH 27XBRD (SUTURE) IMPLANT
SUT VICRYL 0 SH 27 (SUTURE) IMPLANT
SYR BULB EAR ULCER 3OZ GRN STR (SYRINGE) ×1 IMPLANT
SYR CONTROL 10ML LL (SYRINGE) IMPLANT
TOWEL GREEN STERILE FF (TOWEL DISPOSABLE) ×1 IMPLANT
TUBE CONNECTING 20X1/4 (TUBING) IMPLANT
UNDERPAD 30X36 HEAVY ABSORB (UNDERPADS AND DIAPERS) ×1 IMPLANT

## 2023-10-01 NOTE — Discharge Instructions (Addendum)
Toni Arthurs, MD [facility]  Please read the following information regarding your care after surgery.  Medications  You only need a prescription for the narcotic pain medicine (ex. oxycodone, Percocet, Norco).  All of the other medicines listed below are available over the counter. ? Aleve 2 pills twice a day for the first 3 days after surgery. No Aleve until after 5:00pm today. ? acetominophen (Tylenol) 650 mg every 4-6 hours as you need for minor to moderate pain   Weight Bearing ? Bear weight when you are able on your operated leg or foot.   Cast / Splint / Dressing ? Keep your splint, cast or dressing clean and dry.  Don't put anything (coat hanger, pencil, etc) down inside of it.  If it gets damp, use a hair dryer on the cool setting to dry it.  If it gets soaked, call the office to schedule an appointment for a cast change.    After your dressing, cast or splint is removed; you may shower, but do not soak or scrub the wound.  Allow the water to run over it, and then gently pat it dry.  Swelling It is normal for you to have swelling where you had surgery.  To reduce swelling and pain, keep your toes above your nose for at least 3 days after surgery.  It may be necessary to keep your foot or leg elevated for several weeks.  If it hurts, it should be elevated.  Follow Up Call my office at [phone] when you are discharged from the hospital or surgery center to schedule an appointment to be seen two weeks after surgery.  Call my office at [phone] if you develop a fever >101.5 F, nausea, vomiting, bleeding from the surgical site or severe pain.       Post Anesthesia Home Care Instructions  Activity: Get plenty of rest for the remainder of the day. A responsible individual must stay with you for 24 hours following the procedure.  For the next 24 hours, DO NOT: -Drive a car -Advertising copywriter -Drink alcoholic beverages -Take any medication unless instructed by your  physician -Make any legal decisions or sign important papers.  Meals: Start with liquid foods such as gelatin or soup. Progress to regular foods as tolerated. Avoid greasy, spicy, heavy foods. If nausea and/or vomiting occur, drink only clear liquids until the nausea and/or vomiting subsides. Call your physician if vomiting continues.  Special Instructions/Symptoms: Your throat may feel dry or sore from the anesthesia or the breathing tube placed in your throat during surgery. If this causes discomfort, gargle with warm salt water. The discomfort should disappear within 24 hours.  If you had a scopolamine patch placed behind your ear for the management of post- operative nausea and/or vomiting:  1. The medication in the patch is effective for 72 hours, after which it should be removed.  Wrap patch in a tissue and discard in the trash. Wash hands thoroughly with soap and water. 2. You may remove the patch earlier than 72 hours if you experience unpleasant side effects which may include dry mouth, dizziness or visual disturbances. 3. Avoid touching the patch. Wash your hands with soap and water after contact with the patch.

## 2023-10-01 NOTE — Anesthesia Postprocedure Evaluation (Signed)
Anesthesia Post Note  Patient: Leah Willis  Procedure(s) Performed: Removal of deep implants right First metatarsal and Partial Hallux nail removal (Right: Foot)     Patient location during evaluation: PACU Anesthesia Type: MAC Level of consciousness: awake and alert Pain management: pain level controlled Vital Signs Assessment: post-procedure vital signs reviewed and stable Respiratory status: spontaneous breathing, nonlabored ventilation, respiratory function stable and patient connected to nasal cannula oxygen Cardiovascular status: stable and blood pressure returned to baseline Postop Assessment: no apparent nausea or vomiting Anesthetic complications: no   No notable events documented.  Last Vitals:  Vitals:   10/01/23 1135 10/01/23 1145  BP: 92/60 (!) 103/47  Pulse: 67 71  Resp: 13 16  Temp:  (!) 36.2 C  SpO2: 99% 96%    Last Pain:  Vitals:   10/01/23 1145  TempSrc:   PainSc: 0-No pain                 Mariann Barter

## 2023-10-01 NOTE — H&P (Addendum)
Leah Willis is an 52 y.o. female.   Chief Complaint: Right forefoot pain HPI: 52 year old female without significant past medical history is now 9 months status post right foot bunion correction.  She has painful hardware at the dorsum of the first metatarsal that is limiting her shoewear.  She presents today for removal of the painful implants.  Past Medical History:  Diagnosis Date   Spherocytosis, hereditary (HCC) 01/08/2015    Past Surgical History:  Procedure Laterality Date   BUNIONECTOMY Right 01/08/2023   Procedure: SCARF AND Ralene Bathe;  Surgeon: Toni Arthurs, MD;  Location: [facility];  Service: Orthopedics;  Laterality: Right;  GENERAL, REGIONAL   FOOT SURGERY Left    bunionectomy    Family History  Problem Relation Age of Onset   Cancer Mother        uterine   Healthy Father    Memory loss Father    Colon cancer Neg Hx    Esophageal cancer Neg Hx    Rectal cancer Neg Hx    Stomach cancer Neg Hx    Social History:  reports that she has never smoked. She has never used smokeless tobacco. She reports that she does not drink alcohol and does not use drugs.  Allergies: No Known Allergies  Medications Prior to Admission  Medication Sig Dispense Refill   b complex vitamins capsule Take 1 capsule by mouth daily.     CALCIUM PO Take by mouth daily.     cholecalciferol (VITAMIN D) 400 units TABS tablet Take 400 Units by mouth.     folic acid (FOLVITE) 1 MG tablet Take 1 tablet (1 mg total) by mouth daily. 30 tablet 12   MAGNESIUM PO Take by mouth.     Multiple Vitamins-Minerals (MULTIVITAMIN WITH MINERALS) tablet Take 1 tablet by mouth daily.     zolpidem (AMBIEN) 5 MG tablet Take 1 tablet (5 mg total) by mouth at bedtime as needed for sleep. 15 tablet 0    No results found for this or any previous visit (from the past 48 hour(s)). No results found.  Review of Systems no recent fever, chills, nausea, vomiting or changes in her appetite  Height  5\' 6"  (1.676 m), weight 62.6 kg, last menstrual period 09/03/2023. Physical Exam  Well-nourished well-developed woman in no apparent distress.  Alert and oriented.  Normal mood and affect.  Normal gait.  The right foot has healed surgical incisions.  Screw heads are palpable at the dorsum of the first metatarsal.  Pulses are palpable in the foot.  Normal sensibility to light touch dorsally and plantarly at the forefoot. Assessment/Plan Painful hardware right first metatarsal -to the operating room today for removal of the painful deep implants.  The risks and benefits of the alternative treatment options have been discussed in detail.  The patient wishes to proceed with surgery and specifically understands risks of bleeding, infection, nerve damage, blood clots, need for additional surgery, amputation and death.   Toni Arthurs, MD 10/14/2023, 8:27 AM  Addendum: Since I saw the patient in the office 3 days ago, she has developed an ingrown toenail at the right hallux lateral eponychial fold.  We discussed the risks and benefits of partial nail removal to treat the ingrown nail.  She understands the risks and benefits and would like to proceed.  I added this to the consent form for today's hardware removal from the first ray.  She and I both initialed the form acknowledging this change.

## 2023-10-01 NOTE — Anesthesia Preprocedure Evaluation (Signed)
Anesthesia Evaluation  Patient identified by MRN, date of birth, ID band Patient awake    Reviewed: Allergy & Precautions, NPO status , Patient's Chart, lab work & pertinent test results, reviewed documented beta blocker date and time   History of Anesthesia Complications Negative for: history of anesthetic complications  Airway Mallampati: II  TM Distance: >3 FB Neck ROM: Full    Dental no notable dental hx.    Pulmonary neg COPD   breath sounds clear to auscultation       Cardiovascular (-) hypertension(-) CAD, (-) Past MI, (-) Cardiac Stents and (-) CABG (-) pacemaker Rhythm:Regular Rate:Normal     Neuro/Psych  Headaches, neg Seizures    GI/Hepatic ,neg GERD  ,,(+) neg Cirrhosis        Endo/Other    Renal/GU Renal disease     Musculoskeletal   Abdominal   Peds  Hematology  (+) Blood dyscrasia, anemia   Anesthesia Other Findings   Reproductive/Obstetrics                             Anesthesia Physical Anesthesia Plan  ASA: 2  Anesthesia Plan: MAC   Post-op Pain Management:    Induction: Intravenous  PONV Risk Score and Plan: 2 and Propofol infusion  Airway Management Planned:   Additional Equipment:   Intra-op Plan:   Post-operative Plan:   Informed Consent: I have reviewed the patients History and Physical, chart, labs and discussed the procedure including the risks, benefits and alternatives for the proposed anesthesia with the patient or authorized representative who has indicated his/her understanding and acceptance.     Dental advisory given  Plan Discussed with: CRNA  Anesthesia Plan Comments:        Anesthesia Quick Evaluation

## 2023-10-01 NOTE — Anesthesia Procedure Notes (Signed)
Procedure Name: MAC Date/Time: 10/01/2023 10:37 AM  Performed by: Cleda Clarks, CRNAPre-anesthesia Checklist: Patient identified, Emergency Drugs available, Suction available, Patient being monitored and Timeout performed Patient Re-evaluated:Patient Re-evaluated prior to induction Oxygen Delivery Method: Simple face mask Preoxygenation: Pre-oxygenation with 100% oxygen Placement Confirmation: positive ETCO2

## 2023-10-01 NOTE — Op Note (Signed)
10/01/2023  11:23 AM  PATIENT:  Leah Willis  52 y.o. female  PRE-OPERATIVE DIAGNOSIS:   1.  Painful hardware at the right 1st MT      2.  Ingrown toenail at the right hallux lateral eponychial fold    POST-OPERATIVE DIAGNOSIS:  same  Procedure(s): 1.  Removal of deep implants right foot   2.  Partial removal of the right hallux nail plate  SURGEON:  Toni Arthurs, MD  ASSISTANT:  none  ANESTHESIA:   local, mac  EBL:  minimal   TOURNIQUET:   Total Tourniquet Time Documented: Calf (Right) - 11 minutes Total: Calf (Right) - 11 minutes  COMPLICATIONS:  None apparent  DISPOSITION:  Extubated, awake and stable to recovery.  INDICATION FOR PROCEDURE:  52 y/o female c/o R foot pain after bunion correction earlier this year.  She has a prominent screw head at the dorsal 1st MT.  THe osteotomy is healed.  She presents today for hardware removal.  Since I saw her Monday she has developed an ingrown toenail at the lateral right hallux.  She would like to proceed with removal of the lateral portion of the nail.  The risks and benefits of the alternative treatment options have been discussed in detail.  The patient wishes to proceed with surgery and specifically understands risks of bleeding, infection, nerve damage, blood clots, need for additional surgery, amputation and death.   PROCEDURE IN DETAIL: After preoperative consent was obtained and the correct operative site was identified, the patient was brought to the operating room and placed supine on the operating table.  IV sedation was administered along with preoperative antibiotics.  Surgical timeout was taken.  The right foot first ray was anesthetized with a metatarsal block of Marcaine and epinephrine.  The right lower extremity was then prepped and draped in standard sterile fashion.  The foot was exsanguinated and a 4 inch Esmarch tourniquet wrapped around the ankle.  The prominent screw head was identified.  A small incision was  made over the screw head.  Dissection was carried sharply down through the subcutaneous tissues.  The screw head was identified.  The screwdriver was used to remove the screw after cleaning all the fibrous tissue off the screw head.  The more proximal screw was identified and removed in the same fashion.  The wound was irrigated copiously.  Subcutaneous tissues were approximated with inverted simple suture of 3-0 Monocryl.  Steri-Strips were applied over the incision.  Attention was turned to the lateral eponychial fold of the hallux.  The nail was elevated from the nailbed.  A longitudinal cut was made removing the lateral 20% of the nail after freeing it from the proximal eponychial fold.  Hypertrophic soft tissue was removed from the nailbed.  The wound was irrigated copiously.  Sterile dressings were applied followed by a compression wrap.  The tourniquet was released after application of the dressings.  The patient was awakened from anesthesia and transported to the recovery room in stable condition.   FOLLOW UP PLAN: Weightbearing as tolerated in a flat postop shoe.  Follow-up in the office in 2 weeks for a wound check if she has any concerns.  Otherwise she can return to her regular activities as she tolerates.

## 2023-10-01 NOTE — Transfer of Care (Signed)
Immediate Anesthesia Transfer of Care Note  Patient: Leah Willis  Procedure(s) Performed: Removal of deep implants right First metatarsal and Partial Hallux nail removal (Right: Foot)  Patient Location: PACU  Anesthesia Type:MAC  Level of Consciousness: awake, alert , and oriented  Airway & Oxygen Therapy: Patient Spontanous Breathing and Patient connected to face mask oxygen  Post-op Assessment: Report given to RN and Post -op Vital signs reviewed and stable  Post vital signs: Reviewed and stable  Last Vitals:  Vitals Value Taken Time  BP    Temp    Pulse 88 10/01/23 1113  Resp 18 10/01/23 1113  SpO2 100 % 10/01/23 1113  Vitals shown include unfiled device data.  Last Pain:  Vitals:   10/01/23 0846  TempSrc: Oral  PainSc:       Patients Stated Pain Goal: 7 (10/01/23 0843)  Complications: No notable events documented.

## 2023-10-05 ENCOUNTER — Encounter (HOSPITAL_BASED_OUTPATIENT_CLINIC_OR_DEPARTMENT_OTHER): Payer: Self-pay | Admitting: Orthopedic Surgery

## 2023-10-05 NOTE — Plan of Care (Signed)
[facility] Tonsillectomy/Adenoidectomy, Postoperative PEDS care plan entered in error.

## 2023-12-03 DIAGNOSIS — Z6822 Body mass index (BMI) 22.0-22.9, adult: Secondary | ICD-10-CM | POA: Diagnosis not present

## 2023-12-03 DIAGNOSIS — Z01419 Encounter for gynecological examination (general) (routine) without abnormal findings: Secondary | ICD-10-CM | POA: Diagnosis not present

## 2023-12-03 DIAGNOSIS — R319 Hematuria, unspecified: Secondary | ICD-10-CM | POA: Diagnosis not present

## 2023-12-03 DIAGNOSIS — Z1231 Encounter for screening mammogram for malignant neoplasm of breast: Secondary | ICD-10-CM | POA: Diagnosis not present

## 2024-02-02 DIAGNOSIS — H5213 Myopia, bilateral: Secondary | ICD-10-CM | POA: Diagnosis not present

## 2024-02-15 ENCOUNTER — Telehealth: Payer: Self-pay

## 2024-02-15 NOTE — Telephone Encounter (Signed)
 Looks like last year did Vit D, CBC, Hepatic function, TSH, BMP, and a Lipid I was going to order these same labs is that okay? And are you okay with lab appointment prior to appt or would you prefer it be after

## 2024-02-15 NOTE — Telephone Encounter (Signed)
 Copied from [id number]. Topic: Clinical - Request for Lab/Test Order >> Feb 15, 2024 10:19 AM Corin V wrote: Reason for CRM: Patient would like to know if she will need to have fasting labs at her physical. If so, please enter those and call her to schedule those before hand since it is an afternoon appointment.

## 2024-02-25 ENCOUNTER — Encounter: Payer: Commercial Managed Care - PPO | Admitting: Family Medicine

## 2024-03-10 ENCOUNTER — Encounter: Payer: Self-pay | Admitting: Family Medicine

## 2024-03-18 ENCOUNTER — Encounter: Payer: Self-pay | Admitting: Podiatry

## 2024-03-18 ENCOUNTER — Ambulatory Visit: Payer: Self-pay | Admitting: Podiatry

## 2024-03-18 DIAGNOSIS — M7751 Other enthesopathy of right foot: Secondary | ICD-10-CM

## 2024-03-18 DIAGNOSIS — B351 Tinea unguium: Secondary | ICD-10-CM | POA: Diagnosis not present

## 2024-03-18 DIAGNOSIS — L608 Other nail disorders: Secondary | ICD-10-CM

## 2024-03-18 DIAGNOSIS — L603 Nail dystrophy: Secondary | ICD-10-CM | POA: Diagnosis not present

## 2024-03-18 MED ORDER — TRIAMCINOLONE ACETONIDE 10 MG/ML IJ SUSP
5.0000 mg | Freq: Once | INTRAMUSCULAR | Status: AC
Start: 2024-03-18 — End: 2024-03-18
  Administered 2024-03-18: 5 mg via INTRAMUSCULAR

## 2024-03-18 NOTE — Patient Instructions (Signed)

## 2024-03-21 NOTE — Progress Notes (Signed)
 Subjective:  Patient ID: Leah Willis, female    DOB: 06/14/1971,  MRN: [id number]  Chief Complaint  Patient presents with   Nail Problem    Patient is concern about the way her right toe nail is growing out, she states it is because of the foot surgery she had a year ago, and may be interested in a injection for pain and inflammation in right foot     Discussed the use of AI scribe software for clinical note transcription with the patient, who gave verbal consent to proceed.  History of Present Illness The patient, with a history of right foot surgery, presents with ongoing foot pain and changes to the toenail. The surgery, performed by Dr. Victorino Dike, involved the insertion of screws, which subsequently came out and required a second procedure for removal. Over the past year and a half, the patient has developed a pincher toenail, which becomes painful and inflamed after walking more than three or four miles. The patient reports that the toenail has changed shape and color, raising concerns about a potential ingrown toenail. The patient also reports that the foot is still tender, and there is concern about the potential for an ingrown toenail to the big toe. The patient has tried using Vaseline to manage the issue but is seeking further advice on how to manage the pain and the toenail issue.  She thinks the nail started changes today because the patient is walking.      Objective:    Physical Exam General: AAO x3, NAD  Dermatological: Hallux toenail is dystrophic with yellow discoloration and incurvation present both medial lateral nail borders.  There is no edema, erythema or signs of infection.  No bone lesions.  Vascular: Dorsalis Pedis artery and Posterior Tibial artery pedal pulses are 2/4 bilateral with immedate capillary fill time. There is no pain with calf compression, swelling, warmth, erythema.   Neruologic: Grossly intact via light touch bilateral.   Musculoskeletal: There  is tenderness palpation along the right first MTPJ with localized edema.  There is no erythema or warmth.  Unable to appreciate any area pinpoint tenderness otherwise.  MMT prescribed.  Gait: Unassisted, Nonantalgic.     No images are attached to the encounter.    Results   RADIOLOGY Foot X-ray: Normal alignment   Assessment:   1. Capsulitis of toe, right   2. Pincer nail deformity   3. Dermatophytosis of nail      Plan:  Patient was evaluated and treated and all questions answered.  Assessment and Plan Assessment & Plan Pincer toenail with possible fungal infection - Culture and biopsy toenail for infection assessment.  Sharply debrided the nail as it was to [facility] for evaluation.  - Discussed partial nail avulsion.  - Recommend biotin or vitamin for nail health.  Post-surgical inflammation and altered gait Persistent pain and swelling in the right foot post-surgery, contributing to altered gait and toenail issues. Prefers non-pharmacological pain management. - I reviewed an x-ray that she brought to the office today.   -She was to proceed with a steroid injection.  Clean the skin abrasion, alcohol.  Mixture of 1 cc Kenalog 10, 0.5 cc Marcaine plain, 0.5 cc of lidocaine plain was infiltrated into The first MPJ without complications.  Postinjection care discussed.  Tolerated well. - Advise icing post-injection and taking naproxen or acetaminophen. - Monitor response to injection; consider MRI or joint evaluation if symptoms persist.   Return if symptoms worsen or fail to improve.   Molli Hazard  Ardis Rowan DPM

## 2024-03-25 DIAGNOSIS — M7582 Other shoulder lesions, left shoulder: Secondary | ICD-10-CM | POA: Diagnosis not present

## 2024-04-12 ENCOUNTER — Encounter: Payer: Self-pay | Admitting: Podiatry

## 2024-04-12 ENCOUNTER — Other Ambulatory Visit: Payer: Self-pay | Admitting: Podiatry

## 2024-04-13 ENCOUNTER — Other Ambulatory Visit: Payer: Self-pay | Admitting: Podiatry

## 2024-04-13 MED ORDER — EFINACONAZOLE 10 % EX SOLN
1.0000 [drp] | Freq: Every day | CUTANEOUS | 11 refills | Status: AC
Start: 2024-04-13 — End: ?

## 2024-04-22 ENCOUNTER — Encounter: Admitting: Family Medicine

## 2024-05-16 DIAGNOSIS — M9903 Segmental and somatic dysfunction of lumbar region: Secondary | ICD-10-CM | POA: Diagnosis not present

## 2024-05-16 DIAGNOSIS — M5432 Sciatica, left side: Secondary | ICD-10-CM | POA: Diagnosis not present

## 2024-05-19 DIAGNOSIS — M9903 Segmental and somatic dysfunction of lumbar region: Secondary | ICD-10-CM | POA: Diagnosis not present

## 2024-05-19 DIAGNOSIS — M5432 Sciatica, left side: Secondary | ICD-10-CM | POA: Diagnosis not present

## 2024-05-23 ENCOUNTER — Ambulatory Visit: Admitting: Podiatry

## 2024-06-03 ENCOUNTER — Encounter: Admitting: Family Medicine

## 2024-07-15 DIAGNOSIS — R635 Abnormal weight gain: Secondary | ICD-10-CM | POA: Diagnosis not present

## 2024-07-15 DIAGNOSIS — Z0000 Encounter for general adult medical examination without abnormal findings: Secondary | ICD-10-CM | POA: Diagnosis not present

## 2024-07-15 DIAGNOSIS — O99019 Anemia complicating pregnancy, unspecified trimester: Secondary | ICD-10-CM | POA: Diagnosis not present

## 2024-07-15 DIAGNOSIS — D589 Hereditary hemolytic anemia, unspecified: Secondary | ICD-10-CM | POA: Diagnosis not present

## 2024-07-15 DIAGNOSIS — C44311 Basal cell carcinoma of skin of nose: Secondary | ICD-10-CM | POA: Diagnosis not present

## 2024-09-22 ENCOUNTER — Other Ambulatory Visit (HOSPITAL_BASED_OUTPATIENT_CLINIC_OR_DEPARTMENT_OTHER): Payer: Self-pay

## 2024-09-22 MED ORDER — SULFAMETHOXAZOLE-TRIMETHOPRIM 400-80 MG PO TABS
1.0000 | ORAL_TABLET | Freq: Two times a day (BID) | ORAL | 1 refills | Status: AC
  Filled 2024-09-22: qty 6, 3d supply, fill #0

## 2024-09-23 ENCOUNTER — Other Ambulatory Visit (HOSPITAL_BASED_OUTPATIENT_CLINIC_OR_DEPARTMENT_OTHER): Payer: Self-pay

## 2024-09-23 DIAGNOSIS — M2021 Hallux rigidus, right foot: Secondary | ICD-10-CM | POA: Diagnosis not present

## 2024-09-23 MED ORDER — FLUCONAZOLE 150 MG PO TABS
150.0000 mg | ORAL_TABLET | ORAL | 0 refills | Status: AC
  Filled 2024-09-23: qty 2, 6d supply, fill #0

## 2024-09-26 DIAGNOSIS — G4719 Other hypersomnia: Secondary | ICD-10-CM | POA: Diagnosis not present

## 2024-10-03 ENCOUNTER — Other Ambulatory Visit (HOSPITAL_BASED_OUTPATIENT_CLINIC_OR_DEPARTMENT_OTHER): Payer: Self-pay

## 2024-10-03 DIAGNOSIS — D1801 Hemangioma of skin and subcutaneous tissue: Secondary | ICD-10-CM | POA: Diagnosis not present

## 2024-10-03 DIAGNOSIS — Z1283 Encounter for screening for malignant neoplasm of skin: Secondary | ICD-10-CM | POA: Diagnosis not present

## 2024-10-03 DIAGNOSIS — L815 Leukoderma, not elsewhere classified: Secondary | ICD-10-CM | POA: Diagnosis not present

## 2024-10-03 DIAGNOSIS — L821 Other seborrheic keratosis: Secondary | ICD-10-CM | POA: Diagnosis not present

## 2024-10-03 DIAGNOSIS — L309 Dermatitis, unspecified: Secondary | ICD-10-CM | POA: Diagnosis not present

## 2024-10-03 MED ORDER — MUPIROCIN 2 % EX OINT
1.0000 | TOPICAL_OINTMENT | CUTANEOUS | 0 refills | Status: AC
  Filled 2024-10-03: qty 22, 14d supply, fill #0

## 2024-10-08 ENCOUNTER — Other Ambulatory Visit (HOSPITAL_BASED_OUTPATIENT_CLINIC_OR_DEPARTMENT_OTHER): Payer: Self-pay

## 2025-01-13 ENCOUNTER — Other Ambulatory Visit (HOSPITAL_COMMUNITY): Payer: Self-pay
